# Patient Record
Sex: Female | Born: 1985 | Race: White | Hispanic: No | Marital: Married | Smoking: Current every day smoker
Health system: Southern US, Community
[De-identification: ages and names within clinical notes are randomized; demographics above are authoritative.]

## PROBLEM LIST (undated history)

## (undated) DIAGNOSIS — F419 Anxiety disorder, unspecified: Secondary | ICD-10-CM

## (undated) DIAGNOSIS — J45909 Unspecified asthma, uncomplicated: Secondary | ICD-10-CM

## (undated) DIAGNOSIS — F329 Major depressive disorder, single episode, unspecified: Secondary | ICD-10-CM

## (undated) DIAGNOSIS — F32A Depression, unspecified: Secondary | ICD-10-CM

## (undated) HISTORY — DX: Anxiety disorder, unspecified: F41.9

## (undated) HISTORY — DX: Depression, unspecified: F32.A

## (undated) HISTORY — PX: SHOULDER SURGERY: SHX246

---
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
## 2003-10-10 ENCOUNTER — Emergency Department (HOSPITAL_COMMUNITY): Admission: EM | Admit: 2003-10-10 | Discharge: 2003-10-10 | Payer: Self-pay | Admitting: Emergency Medicine

## 2004-03-23 ENCOUNTER — Emergency Department (HOSPITAL_COMMUNITY): Admission: EM | Admit: 2004-03-23 | Discharge: 2004-03-23 | Payer: Self-pay | Admitting: Emergency Medicine

## 2004-05-06 ENCOUNTER — Emergency Department (HOSPITAL_COMMUNITY): Admission: EM | Admit: 2004-05-06 | Discharge: 2004-05-06 | Payer: Self-pay | Admitting: Emergency Medicine

## 2004-06-13 ENCOUNTER — Emergency Department (HOSPITAL_COMMUNITY): Admission: EM | Admit: 2004-06-13 | Discharge: 2004-06-13 | Payer: Self-pay | Admitting: Emergency Medicine

## 2004-07-19 ENCOUNTER — Emergency Department (HOSPITAL_COMMUNITY): Admission: EM | Admit: 2004-07-19 | Discharge: 2004-07-19 | Payer: Self-pay | Admitting: Emergency Medicine

## 2004-12-30 ENCOUNTER — Emergency Department (HOSPITAL_COMMUNITY): Admission: EM | Admit: 2004-12-30 | Discharge: 2004-12-30 | Payer: Self-pay | Admitting: Emergency Medicine

## 2005-05-19 ENCOUNTER — Emergency Department (HOSPITAL_COMMUNITY): Admission: EM | Admit: 2005-05-19 | Discharge: 2005-05-19 | Payer: Self-pay | Admitting: Emergency Medicine

## 2007-04-12 ENCOUNTER — Emergency Department (HOSPITAL_COMMUNITY): Admission: EM | Admit: 2007-04-12 | Discharge: 2007-04-12 | Payer: Self-pay | Admitting: Emergency Medicine

## 2007-08-10 ENCOUNTER — Emergency Department (HOSPITAL_COMMUNITY): Admission: EM | Admit: 2007-08-10 | Discharge: 2007-08-10 | Payer: Self-pay | Admitting: Emergency Medicine

## 2008-12-08 ENCOUNTER — Emergency Department (HOSPITAL_COMMUNITY): Admission: EM | Admit: 2008-12-08 | Discharge: 2008-12-08 | Payer: Self-pay | Admitting: Emergency Medicine

## 2009-01-30 ENCOUNTER — Emergency Department (HOSPITAL_COMMUNITY): Admission: EM | Admit: 2009-01-30 | Discharge: 2009-01-30 | Payer: Self-pay | Admitting: Emergency Medicine

## 2010-07-14 LAB — BASIC METABOLIC PANEL
CO2: 30 mEq/L (ref 19–32)
GFR calc Af Amer: >60 mL/min (ref >60)
GFR calc non Af Amer: >60 mL/min (ref >60)
Glucose, Bld: 103 mg/dL — ABNORMAL HIGH (ref 70–99)
Potassium: 3.5 mEq/L (ref 3.5–5.1)
Sodium: 139 mEq/L (ref 135–145)

## 2010-07-14 LAB — DIFFERENTIAL
Basophils Absolute: 0 10*3/uL (ref 0.0–0.1)
Eosinophils Relative: 0 % (ref 0–5)
Lymphocytes Relative: 20 % (ref 12–46)
Monocytes Absolute: 0.5 10*3/uL (ref 0.1–1.0)
Neutro Abs: 6.6 10*3/uL (ref 1.7–7.7)
Neutrophils Relative %: 73 % (ref 43–77)

## 2010-07-14 LAB — URINE MICROSCOPIC-ADD ON

## 2010-07-14 LAB — RAPID URINE DRUG SCREEN, HOSP PERFORMED
Barbiturates: NOT DETECTED
Opiates: NOT DETECTED
Tetrahydrocannabinol: NOT DETECTED

## 2010-07-14 LAB — URINALYSIS, ROUTINE W REFLEX MICROSCOPIC
Ketones, ur: NEGATIVE mg/dL
Protein, ur: NEGATIVE mg/dL
Specific Gravity, Urine: 1.015 (ref 1.005–1.030)
Urobilinogen, UA: 0.2 mg/dL (ref 0.0–1.0)
pH: 6 (ref 5.0–8.0)

## 2010-07-14 LAB — CBC: HCT: 43.6 % (ref 36.0–46.0)

## 2010-07-14 LAB — PREGNANCY, URINE: Preg Test, Ur: NEGATIVE

## 2010-08-07 ENCOUNTER — Emergency Department (HOSPITAL_COMMUNITY): Payer: Self-pay

## 2010-08-07 ENCOUNTER — Emergency Department (HOSPITAL_COMMUNITY)
Admission: EM | Admit: 2010-08-07 | Discharge: 2010-08-07 | Disposition: A | Discharge status: Home / Self Care | Payer: Self-pay | Attending: Emergency Medicine | Admitting: Emergency Medicine

## 2010-08-07 DIAGNOSIS — R509 Fever, unspecified: Secondary | ICD-10-CM | POA: Insufficient documentation

## 2010-08-07 DIAGNOSIS — R059 Cough, unspecified: Secondary | ICD-10-CM | POA: Insufficient documentation

## 2010-08-07 DIAGNOSIS — J189 Pneumonia, unspecified organism: Secondary | ICD-10-CM | POA: Insufficient documentation

## 2010-08-07 DIAGNOSIS — R05 Cough: Secondary | ICD-10-CM | POA: Insufficient documentation

## 2010-08-15 ENCOUNTER — Emergency Department (HOSPITAL_COMMUNITY)
Admission: EM | Admit: 2010-08-15 | Discharge: 2010-08-16 | Disposition: A | Discharge status: Home / Self Care | Payer: Self-pay | Attending: Emergency Medicine | Admitting: Emergency Medicine

## 2010-08-15 DIAGNOSIS — Y998 Other external cause status: Secondary | ICD-10-CM | POA: Insufficient documentation

## 2010-08-15 DIAGNOSIS — R1032 Left lower quadrant pain: Secondary | ICD-10-CM | POA: Insufficient documentation

## 2010-08-15 DIAGNOSIS — R071 Chest pain on breathing: Secondary | ICD-10-CM | POA: Insufficient documentation

## 2010-08-15 DIAGNOSIS — J45909 Unspecified asthma, uncomplicated: Secondary | ICD-10-CM | POA: Insufficient documentation

## 2010-08-15 DIAGNOSIS — Y92410 Unspecified street and highway as the place of occurrence of the external cause: Secondary | ICD-10-CM | POA: Insufficient documentation

## 2010-08-16 ENCOUNTER — Emergency Department (HOSPITAL_COMMUNITY): Payer: Self-pay

## 2010-08-16 LAB — POCT PREGNANCY, URINE: Preg Test, Ur: NEGATIVE

## 2010-08-16 MED ORDER — IOHEXOL 300 MG/ML  SOLN
100.0000 mL | Freq: Once | INTRAMUSCULAR | Status: AC | PRN
  Administered 2010-08-16: 100 mL via INTRAVENOUS

## 2013-02-26 ENCOUNTER — Emergency Department (HOSPITAL_COMMUNITY)
Admission: EM | Admit: 2013-02-26 | Discharge: 2013-02-26 | Disposition: A | Discharge status: Home / Self Care | Payer: Self-pay | Attending: Emergency Medicine | Admitting: Emergency Medicine

## 2013-02-26 ENCOUNTER — Encounter (HOSPITAL_COMMUNITY): Payer: Self-pay | Admitting: Emergency Medicine

## 2013-02-26 DIAGNOSIS — R21 Rash and other nonspecific skin eruption: Secondary | ICD-10-CM | POA: Insufficient documentation

## 2013-02-26 DIAGNOSIS — F17200 Nicotine dependence, unspecified, uncomplicated: Secondary | ICD-10-CM | POA: Insufficient documentation

## 2013-02-26 DIAGNOSIS — J45909 Unspecified asthma, uncomplicated: Secondary | ICD-10-CM | POA: Insufficient documentation

## 2013-02-26 DIAGNOSIS — B002 Herpesviral gingivostomatitis and pharyngotonsillitis: Secondary | ICD-10-CM | POA: Insufficient documentation

## 2013-02-26 HISTORY — DX: Unspecified asthma, uncomplicated: J45.909

## 2013-02-26 MED ORDER — HYDROCODONE-ACETAMINOPHEN 5-325 MG PO TABS: 1.0000 | ORAL_TABLET | ORAL | Status: DC | PRN

## 2013-02-26 MED ORDER — ACYCLOVIR 400 MG PO TABS: 400.0000 mg | ORAL_TABLET | Freq: Three times a day (TID) | ORAL | Status: DC

## 2013-02-26 NOTE — ED Notes (Signed)
Pt alert & oriented x4, stable gait. Patient given discharge instructions, paperwork & prescription(s). Patient  instructed to stop at the registration desk to finish any additional paperwork. Patient verbalized understanding. Pt left department w/ no further questions. 

## 2013-02-26 NOTE — ED Provider Notes (Signed)
CSN: [id number]     Arrival date & time 02/26/13  4098 History   First MD Initiated Contact with Patient 02/26/13 0957     Chief Complaint  Patient presents with  . Mouth Lesions   (Consider location/radiation/quality/duration/timing/severity/associated sxs/prior Treatment) Patient is a 27 y.o. female presenting with mouth sores. The history is provided by the patient.  Mouth Lesions Location:  Upper lip Quality:  Painful, crusty and multiple Pain details:    Quality:  Hot, sore and tingling   Severity:  Moderate   Duration:  2 days   Timing:  Constant   Progression:  Worsening Onset quality:  Gradual Chronicity:  Recurrent Context: stress   Relieved by:  Nothing Worsened by:  Nothing tried Ineffective treatments:  None tried Associated symptoms: rash   Associated symptoms: no congestion, no fever and no sore throat    Stacy Rogers is a 27 y.o. female who presents to the ED with lesion to the upper lip that started 2 days ago. Stacy Rogers has been under a lot of stress and often gets the lesions when Stacy Rogers is. The areas burn and hurt.   Past Medical History  Diagnosis Date  . Asthma    History reviewed. No pertinent past surgical history. No family history on file. History  Substance Use Topics  . Smoking status: Current Every Day Smoker  . Smokeless tobacco: Not on file  . Alcohol Use: No   OB History   Grav Para Term Preterm Abortions TAB SAB Ect Mult Living                 Review of Systems  Constitutional: Negative for fever and chills.  HENT: Positive for mouth sores. Negative for congestion, sore throat, trouble swallowing and voice change.   Respiratory: Negative for cough and shortness of breath.   Gastrointestinal: Negative for nausea, vomiting and abdominal pain.  Genitourinary: Negative for dysuria, urgency and frequency.  Musculoskeletal: Negative for back pain.  Skin: Positive for rash.  Neurological: Negative for light-headedness and headaches.    Psychiatric/Behavioral: The patient is not nervous/anxious.     Allergies  Aspirin  Home Medications  No current outpatient prescriptions on file. BP 109/73  Pulse 95  Temp(Src) 98.8 F (37.1 C) (Oral)  Resp 18  Ht 5\' 4"  (1.626 m)  Wt 115 lb (52.164 kg)  BMI 19.73 kg/m2  SpO2 99%  LMP 02/25/2013 Physical Exam  Nursing note and vitals reviewed. Constitutional: Stacy Rogers is oriented to person, place, and time. Stacy Rogers appears well-developed and well-nourished. No distress.  HENT:  Head: Normocephalic.  Right Ear: Tympanic membrane normal.  Left Ear: Tympanic membrane normal.  Mouth/Throat: Uvula is midline and oropharynx is clear and moist.    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   Eyes: EOM are normal.  Neck: Neck supple.  Pulmonary/Chest: Effort normal.  Abdominal: Soft. There is no tenderness.  Musculoskeletal: Normal range of motion.  Lymphadenopathy:    Stacy Rogers has no cervical adenopathy.  Neurological: Stacy Rogers is alert and oriented to person, place, and time. No cranial nerve deficit.  Skin: Skin is warm and dry.  Psychiatric: Stacy Rogers has a normal mood and affect. Her behavior is normal.    ED Course  Procedures  EKG Interpretation   None       MDM  27 y.o. female with recurrent herpetic lesions to the upper lip. Will treat with antiviral medications. Stacy Rogers will follow up if symptoms worsen.  Discussed with the patient and all questioned fully  answered.    Medication List    TAKE these medications       acyclovir 400 MG tablet  Commonly known as:  ZOVIRAX  Take 1 tablet (400 mg total) by mouth 3 (three) times daily.     HYDROcodone-acetaminophen 5-325 MG per tablet  Commonly known as:  NORCO/VICODIN  Take 1 tablet by mouth every 4 (four) hours as needed.      ASK your doctor about these medications       acetaminophen 325 MG tablet  Commonly known as:  TYLENOL  Take 650 mg by mouth every 6 (six) hours as needed (cramps).     albuterol 108 (90 BASE)  MCG/ACT inhaler  Commonly known as:  PROVENTIL HFA;VENTOLIN HFA  Inhale 2 puffs into the lungs 2 (two) times daily as needed for wheezing or shortness of breath.             Janne Napoleon, [place] 02/26/13 [phone]

## 2013-02-26 NOTE — ED Notes (Signed)
Pt reports woke up Monday morning with lip swollen then started breaking out in sores.  Pt says has history of cold sores but never multiple sores at one time.

## 2013-02-27 NOTE — ED Provider Notes (Signed)
Medical screening examination/treatment/procedure(s) were performed by non-physician practitioner and as supervising physician I was immediately available for consultation/collaboration.  EKG Interpretation   None        Illana Nolting, MD 02/27/13 1326 

## 2016-10-27 ENCOUNTER — Encounter: Payer: Self-pay | Admitting: Family

## 2016-10-27 ENCOUNTER — Ambulatory Visit (INDEPENDENT_AMBULATORY_CARE_PROVIDER_SITE_OTHER): Payer: BLUE CROSS/BLUE SHIELD | Admitting: Family

## 2016-10-27 VITALS — BP 93/69 | HR 64 | Temp 98.4°F | Ht 64.0 in | Wt 94.0 lb

## 2016-10-27 DIAGNOSIS — R636 Underweight: Secondary | ICD-10-CM | POA: Diagnosis not present

## 2016-10-27 DIAGNOSIS — F17200 Nicotine dependence, unspecified, uncomplicated: Secondary | ICD-10-CM | POA: Diagnosis not present

## 2016-10-27 DIAGNOSIS — F419 Anxiety disorder, unspecified: Secondary | ICD-10-CM

## 2016-10-27 DIAGNOSIS — B002 Herpesviral gingivostomatitis and pharyngotonsillitis: Secondary | ICD-10-CM | POA: Diagnosis not present

## 2016-10-27 DIAGNOSIS — F32A Depression, unspecified: Secondary | ICD-10-CM

## 2016-10-27 DIAGNOSIS — J4540 Moderate persistent asthma, uncomplicated: Secondary | ICD-10-CM

## 2016-10-27 DIAGNOSIS — F329 Major depressive disorder, single episode, unspecified: Secondary | ICD-10-CM | POA: Diagnosis not present

## 2016-10-27 DIAGNOSIS — J45909 Unspecified asthma, uncomplicated: Secondary | ICD-10-CM | POA: Insufficient documentation

## 2016-10-27 DIAGNOSIS — Z720 Tobacco use: Secondary | ICD-10-CM | POA: Insufficient documentation

## 2016-10-27 DIAGNOSIS — R634 Abnormal weight loss: Secondary | ICD-10-CM

## 2016-10-27 DIAGNOSIS — F411 Generalized anxiety disorder: Secondary | ICD-10-CM | POA: Insufficient documentation

## 2016-10-27 MED ORDER — MONTELUKAST SODIUM 10 MG PO TABS: 10.0000 mg | ORAL_TABLET | Freq: Every day | ORAL | 1 refills | Status: AC

## 2016-10-27 MED ORDER — ALBUTEROL SULFATE HFA 108 (90 BASE) MCG/ACT IN AERS: 2.0000 | INHALATION_SPRAY | Freq: Two times a day (BID) | RESPIRATORY_TRACT | 2 refills | Status: AC | PRN

## 2016-10-27 MED ORDER — PREDNISONE 10 MG (21) PO TBPK
ORAL_TABLET | ORAL | 0 refills | Status: DC
Start: 2016-10-27 — End: 2016-12-28

## 2016-10-27 MED ORDER — ESCITALOPRAM OXALATE 10 MG PO TABS: 10.0000 mg | ORAL_TABLET | Freq: Every day | ORAL | 3 refills | Status: DC

## 2016-10-27 MED ORDER — ACYCLOVIR 400 MG PO TABS: 400.0000 mg | ORAL_TABLET | Freq: Three times a day (TID) | ORAL | 0 refills | Status: DC | PRN

## 2016-10-27 NOTE — Patient Instructions (Signed)

## 2016-10-27 NOTE — Progress Notes (Signed)
Subjective:    Patient ID: Stacy Rogers, female    DOB: 11-Aug-1985, 31 y.o.   MRN: [id number]  Pt presents to the office today to establish care. PT states she just got insurance and was seen by the [facility].  PT states she has lost 17 lb over the 3-4 months. Pt states she started a new job in March and has more anxiety and is very physically demanding.  Asthma  She complains of cough, sputum production and wheezing. This is a chronic problem. The current episode started more than 1 year ago. The problem occurs intermittently. The problem has been waxing and waning. The cough is productive. Her symptoms are aggravated by pollen, strenuous activity, emotional stress and exercise. Her symptoms are alleviated by beta-agonist. She reports minimal improvement on treatment. Her past medical history is significant for asthma.  Anxiety  Presents for initial visit. Onset was 1 to 5 years ago. The problem has been unchanged. Symptoms include depressed mood, excessive worry, insomnia, irritability, nervous/anxious behavior, palpitations, panic and restlessness. Symptoms occur constantly. The severity of symptoms is moderate.   Her past medical history is significant for asthma.  Oral Herpes PT takes acyclovir as needed. States this works well.     Review of Systems  Constitutional: Positive for irritability.  Respiratory: Positive for cough, sputum production and wheezing.   Cardiovascular: Positive for palpitations.  Psychiatric/Behavioral: The patient is nervous/anxious and has insomnia.   All other systems reviewed and are negative.  Family History  Problem Relation Age of Onset  . COPD Mother   . Alcohol abuse Father    Social History   Social History  . Marital status: Single    Spouse name: N/A  . Number of children: N/A  . Years of education: N/A   Social History Main Topics  . Smoking status: Current Every Day Smoker  . Smokeless tobacco: Never Used  . Alcohol use  No  . Drug use: No  . Sexual activity: Yes   Other Topics Concern  . None   Social History Narrative  . None        Objective:   Physical Exam  Constitutional: She is oriented to person, place, and time. She appears well-developed and well-nourished. No distress.  HENT:  Head: Normocephalic and atraumatic.  Right Ear: External ear normal.  Left Ear: External ear normal.  Nose: Nose normal.  Mouth/Throat: Oropharynx is clear and moist.  Eyes: Pupils are equal, round, and reactive to light.  Neck: Normal range of motion. Neck supple. No thyromegaly present.  Cardiovascular: Normal rate, regular rhythm, normal heart sounds and intact distal pulses.   No murmur heard. Pulmonary/Chest: Effort normal. No respiratory distress. She has wheezes.  Abdominal: Soft. Bowel sounds are normal. She exhibits no distension. There is no tenderness.  Musculoskeletal: Normal range of motion. She exhibits no edema or tenderness.  Neurological: She is alert and oriented to person, place, and time.  Skin: Skin is warm and dry.  Psychiatric: Her behavior is normal. Judgment and thought content normal. Her mood appears anxious.  Vitals reviewed.    BP 93/69   Pulse 64   Temp 98.4 F (36.9 C) (Oral)   Ht _0  (1.626 m)   Wt 94 lb (42.6 kg)   BMI 16.14 kg/m      Assessment & Plan:  1. Moderate persistent asthma without complication Smoking cessation discussed Will start singulair today - CMP14+EGFR - montelukast (SINGULAIR) 10 MG tablet; Take 1 tablet (10  mg total) by mouth at bedtime.  Dispense: 90 tablet; Refill: 1 - albuterol (PROVENTIL HFA;VENTOLIN HFA) 108 (90 Base) MCG/ACT inhaler; Inhale 2 puffs into the lungs 2 (two) times daily as needed for wheezing or shortness of breath.  Dispense: 8 g; Refill: 2 - predniSONE (STERAPRED UNI-PAK 21 TAB) 10 MG (21) TBPK tablet; Use as directed  Dispense: 21 tablet; Refill: 0  2. Current smoker - CMP14+EGFR  3. Oral herpes - CMP14+EGFR -  acyclovir (ZOVIRAX) 400 MG tablet; Take 1 tablet (400 mg total) by mouth 3 (three) times daily as needed.  Dispense: 50 tablet; Refill: 0  4. Anxiety and depression Start lexapro 10 mg  Stress management discussed RTO in 6 weeks - CMP14+EGFR - escitalopram (LEXAPRO) 10 MG tablet; Take 1 tablet (10 mg total) by mouth daily.  Dispense: 90 tablet; Refill: 3  5. Weight loss, unintentional - CMP14+EGFR - Thyroid Panel With TSH  6. Underweight Start protein shake with meals TID   Continue all meds Labs pending Health Maintenance reviewed Diet and exercise encouraged RTO 6 weeks to recheck GAD and Depression  Evelina Dun, FNP

## 2016-10-27 NOTE — Addendum Note (Signed)
Addended by: Jannifer RodneyHAWKS, CHRISTY A on: 10/27/2016 09:55 AM   Modules accepted: Orders

## 2016-10-28 LAB — CMP14+EGFR
A/G RATIO: 2 (ref 1.2–2.2)
ALBUMIN: 4.7 g/dL (ref 3.5–5.5)
ALK PHOS: 37 IU/L — AB (ref 39–117)
ALT: 12 IU/L (ref 0–32)
AST: 17 IU/L (ref 0–40)
BILIRUBIN TOTAL: 0.4 mg/dL (ref 0.0–1.2)
BUN / CREAT RATIO: 11 (ref 9–23)
BUN: 7 mg/dL (ref 6–20)
CHLORIDE: 102 mmol/L (ref 96–106)
CO2: 22 mmol/L (ref 20–29)
Calcium: 9 mg/dL (ref 8.7–10.2)
Creatinine, Ser: 0.63 mg/dL (ref 0.57–1.00)
GFR calc non Af Amer: 118 mL/min/{1.73_m2} (ref >59)
GFR, EST AFRICAN AMERICAN: 136 mL/min/{1.73_m2} (ref >59)
Globulin, Total: 2.4 g/dL (ref 1.5–4.5)
Glucose: 72 mg/dL (ref 65–99)
POTASSIUM: 4.1 mmol/L (ref 3.5–5.2)
Sodium: 141 mmol/L (ref 134–144)
TOTAL PROTEIN: 7.1 g/dL (ref 6.0–8.5)

## 2016-10-28 LAB — THYROID PANEL WITH TSH
Free Thyroxine Index: 2.3 (ref 1.2–4.9)
T3 Uptake Ratio: 30 % (ref 24–39)
T4, Total: 7.7 ug/dL (ref 4.5–12.0)
TSH: 0.911 u[IU]/mL (ref 0.450–4.500)

## 2016-10-30 ENCOUNTER — Encounter: Payer: Self-pay | Admitting: Family

## 2016-12-02 ENCOUNTER — Encounter: Payer: Self-pay | Admitting: Family

## 2016-12-05 ENCOUNTER — Other Ambulatory Visit: Payer: Self-pay | Admitting: Family

## 2016-12-05 MED ORDER — ESCITALOPRAM OXALATE 20 MG PO TABS: 20.0000 mg | ORAL_TABLET | Freq: Every day | ORAL | 5 refills | Status: DC

## 2016-12-12 ENCOUNTER — Ambulatory Visit: Payer: BLUE CROSS/BLUE SHIELD | Admitting: Family

## 2016-12-13 ENCOUNTER — Encounter: Payer: Self-pay | Admitting: Family

## 2016-12-13 ENCOUNTER — Telehealth: Payer: Self-pay | Admitting: Family

## 2016-12-13 NOTE — Telephone Encounter (Signed)
Patient phone numbers are not working therefore was unable to leave a voicemail or speak to them about [facility].

## 2016-12-19 ENCOUNTER — Ambulatory Visit: Payer: BLUE CROSS/BLUE SHIELD | Admitting: Family

## 2016-12-19 ENCOUNTER — Encounter: Payer: Self-pay | Admitting: Family

## 2016-12-28 ENCOUNTER — Encounter: Payer: Self-pay | Admitting: Family

## 2016-12-28 ENCOUNTER — Ambulatory Visit (INDEPENDENT_AMBULATORY_CARE_PROVIDER_SITE_OTHER): Payer: BLUE CROSS/BLUE SHIELD | Admitting: Family

## 2016-12-28 ENCOUNTER — Telehealth: Payer: Self-pay | Admitting: Family

## 2016-12-28 VITALS — BP 93/62 | HR 65 | Temp 97.6°F | Ht 64.0 in | Wt 97.8 lb

## 2016-12-28 DIAGNOSIS — J4540 Moderate persistent asthma, uncomplicated: Secondary | ICD-10-CM | POA: Diagnosis not present

## 2016-12-28 DIAGNOSIS — F331 Major depressive disorder, recurrent, moderate: Secondary | ICD-10-CM | POA: Diagnosis not present

## 2016-12-28 DIAGNOSIS — F17200 Nicotine dependence, unspecified, uncomplicated: Secondary | ICD-10-CM | POA: Diagnosis not present

## 2016-12-28 DIAGNOSIS — R636 Underweight: Secondary | ICD-10-CM | POA: Diagnosis not present

## 2016-12-28 DIAGNOSIS — Z716 Tobacco abuse counseling: Secondary | ICD-10-CM | POA: Diagnosis not present

## 2016-12-28 DIAGNOSIS — F411 Generalized anxiety disorder: Secondary | ICD-10-CM

## 2016-12-28 DIAGNOSIS — F32A Depression, unspecified: Secondary | ICD-10-CM | POA: Insufficient documentation

## 2016-12-28 DIAGNOSIS — Z720 Tobacco use: Secondary | ICD-10-CM

## 2016-12-28 DIAGNOSIS — J0100 Acute maxillary sinusitis, unspecified: Secondary | ICD-10-CM | POA: Diagnosis not present

## 2016-12-28 DIAGNOSIS — F329 Major depressive disorder, single episode, unspecified: Secondary | ICD-10-CM | POA: Insufficient documentation

## 2016-12-28 MED ORDER — AMOXICILLIN-POT CLAVULANATE 875-125 MG PO TABS: 1.0000 | ORAL_TABLET | Freq: Two times a day (BID) | ORAL | 0 refills | Status: DC

## 2016-12-28 MED ORDER — BUPROPION HCL ER (SR) 150 MG PO TB12: 150.0000 mg | ORAL_TABLET | Freq: Two times a day (BID) | ORAL | 3 refills | Status: DC

## 2016-12-28 MED ORDER — PREDNISONE 10 MG (21) PO TBPK: ORAL_TABLET | ORAL | 0 refills | Status: DC

## 2016-12-28 MED ORDER — FLUTICASONE PROPIONATE 50 MCG/ACT NA SUSP: 2.0000 | Freq: Every day | NASAL | 6 refills | Status: DC

## 2016-12-28 NOTE — Telephone Encounter (Signed)
All new scripts from today were called to [place] [facility].  Left orders on [facility] voice mail to cancel those same scripts.  Unable to contact patient due to phone not in working order.

## 2016-12-28 NOTE — Patient Instructions (Signed)
Steps to Quit Smoking Smoking tobacco can be harmful to your health and can affect almost every organ in your body. Smoking puts you, and those around you, at risk for developing many serious chronic diseases. Quitting smoking is difficult, but it is one of the best things that you can do for your health. It is never too late to quit. What are the benefits of quitting smoking? When you quit smoking, you lower your risk of developing serious diseases and conditions, such as:  Lung cancer or lung disease, such as COPD.  Heart disease.  Stroke.  Heart attack.  Infertility.  Osteoporosis and bone fractures.  Additionally, symptoms such as coughing, wheezing, and shortness of breath may get better when you quit. You may also find that you get sick less often because your body is stronger at fighting off colds and infections. If you are pregnant, quitting smoking can help to reduce your chances of having a baby of low birth weight. How do I get ready to quit? When you decide to quit smoking, create a plan to make sure that you are successful. Before you quit:  Pick a date to quit. Set a date within the next two weeks to give you time to prepare.  Write down the reasons why you are quitting. Keep this list in places where you will see it often, such as on your bathroom mirror or in your car or wallet.  Identify the people, places, things, and activities that make you want to smoke (triggers) and avoid them. Make sure to take these actions: ? Throw away all cigarettes at home, at work, and in your car. ? Throw away smoking accessories, such as ashtrays and lighters. ? Clean your car and make sure to empty the ashtray. ? Clean your home, including curtains and carpets.  Tell your family, friends, and coworkers that you are quitting. Support from your loved ones can make quitting easier.  Talk with your health care provider about your options for quitting smoking.  Find out what treatment  options are covered by your health insurance.  What strategies can I use to quit smoking? Talk with your healthcare provider about different strategies to quit smoking. Some strategies include:  Quitting smoking altogether instead of gradually lessening how much you smoke over a period of time. Research shows that quitting "cold turkey" is more successful than gradually quitting.  Attending in-person counseling to help you build problem-solving skills. You are more likely to have success in quitting if you attend several counseling sessions. Even short sessions of 10 minutes can be effective.  Finding resources and support systems that can help you to quit smoking and remain smoke-free after you quit. These resources are most helpful when you use them often. They can include: ? Online chats with a counselor. ? Telephone quitlines. ? Printed self-help materials. ? Support groups or group counseling. ? Text messaging programs. ? Mobile phone applications.  Taking medicines to help you quit smoking. (If you are pregnant or breastfeeding, talk with your health care provider first.) Some medicines contain nicotine and some do not. Both types of medicines help with cravings, but the medicines that include nicotine help to relieve withdrawal symptoms. Your health care provider may recommend: ? Nicotine patches, gum, or lozenges. ? Nicotine inhalers or sprays. ? Non-nicotine medicine that is taken by mouth.  Talk with your health care provider about combining strategies, such as taking medicines while you are also receiving in-person counseling. Using these two strategies together   makes you more likely to succeed in quitting than if you used either strategy on its own. If you are pregnant or breastfeeding, talk with your health care provider about finding counseling or other support strategies to quit smoking. Do not take medicine to help you quit smoking unless told to do so by your health care  provider. What things can I do to make it easier to quit? Quitting smoking might feel overwhelming at first, but there is a lot that you can do to make it easier. Take these important actions:  Reach out to your family and friends and ask that they support and encourage you during this time. Call telephone quitlines, reach out to support groups, or work with a counselor for support.  Ask people who smoke to avoid smoking around you.  Avoid places that trigger you to smoke, such as bars, parties, or smoke-break areas at work.  Spend time around people who do not smoke.  Lessen stress in your life, because stress can be a smoking trigger for some people. To lessen stress, try: ? Exercising regularly. ? Deep-breathing exercises. ? Yoga. ? Meditating. ? Performing a body scan. This involves closing your eyes, scanning your body from head to toe, and noticing which parts of your body are particularly tense. Purposefully relax the muscles in those areas.  Download or purchase mobile phone or tablet apps (applications) that can help you stick to your quit plan by providing reminders, tips, and encouragement. There are many free apps, such as QuitGuide from the CDC (Centers for Disease Control and Prevention). You can find other support for quitting smoking (smoking cessation) through smokefree.gov and other websites.  How will I feel when I quit smoking? Within the first 24 hours of quitting smoking, you may start to feel some withdrawal symptoms. These symptoms are usually most noticeable 2-3 days after quitting, but they usually do not last beyond 2-3 weeks. Changes or symptoms that you might experience include:  Mood swings.  Restlessness, anxiety, or irritation.  Difficulty concentrating.  Dizziness.  Strong cravings for sugary foods in addition to nicotine.  Mild weight gain.  Constipation.  Nausea.  Coughing or a sore throat.  Changes in how your medicines work in your  body.  A depressed mood.  Difficulty sleeping (insomnia).  After the first 2-3 weeks of quitting, you may start to notice more positive results, such as:  Improved sense of smell and taste.  Decreased coughing and sore throat.  Slower heart rate.  Lower blood pressure.  Clearer skin.  The ability to breathe more easily.  Fewer sick days.  Quitting smoking is very challenging for most people. Do not get discouraged if you are not successful the first time. Some people need to make many attempts to quit before they achieve long-term success. Do your best to stick to your quit plan, and talk with your health care provider if you have any questions or concerns. This information is not intended to replace advice given to you by your health care provider. Make sure you discuss any questions you have with your health care provider. Document Released: 03/21/2001 Document Revised: 11/23/2015 Document Reviewed: 08/11/2014 Elsevier Interactive Patient Education  2017 Elsevier Inc.  

## 2016-12-28 NOTE — Progress Notes (Signed)
Subjective:    Patient ID: Stacy Rogers, female    DOB: [birth year], 31 y.o.   MRN: [id number]  PT presents to the office today recheck GAD and depression. PT states she has gained 3 lbs since our last visit and is still trying to gain weight. Pt also requesting smoking cessation. Pt states her breathing is worse and she feels this is related to her smoking. Pt states she has tried quitting several times with no success.  Anxiety  Presents for follow-up visit. Symptoms include excessive worry, irritability, nervous/anxious behavior and restlessness. Patient reports no depressed mood or panic. Symptoms occur most days. The severity of symptoms is moderate. The quality of sleep is good.   Her past medical history is significant for asthma.  Depression         This is a chronic problem.  The current episode started more than 1 year ago.   The onset quality is gradual.   The problem occurs intermittently.  The problem has been waxing and waning since onset.  Associated symptoms include irritable and restlessness.  Associated symptoms include no helplessness, no hopelessness and not sad.  Past treatments include SSRIs - Selective serotonin reuptake inhibitors.  Past medical history includes anxiety.   Asthma  She complains of cough, frequent throat clearing, sputum production and wheezing. This is a chronic problem. The current episode started 1 to 4 weeks ago. Associated symptoms include nasal congestion and rhinorrhea. Pertinent negatives include no ear congestion, ear pain or fever. Her symptoms are alleviated by rest. Her past medical history is significant for asthma.      Review of Systems  Constitutional: Positive for irritability. Negative for fever.  HENT: Positive for rhinorrhea. Negative for ear pain.   Respiratory: Positive for cough, sputum production and wheezing.   Psychiatric/Behavioral: Positive for depression. The patient is nervous/anxious.   All other systems reviewed and  are negative.      Objective:   Physical Exam  Constitutional: She is oriented to person, place, and time. She appears well-developed and well-nourished. She is irritable. No distress.  HENT:  Head: Normocephalic and atraumatic.  Right Ear: External ear normal.  Left Ear: External ear normal.  Nose: Mucosal edema and rhinorrhea present. Right sinus exhibits maxillary sinus tenderness. Left sinus exhibits maxillary sinus tenderness.  Mouth/Throat: Posterior oropharyngeal erythema present.  Eyes: Pupils are equal, round, and reactive to light.  Neck: Normal range of motion. Neck supple. No thyromegaly present.  Cardiovascular: Normal rate, regular rhythm, normal heart sounds and intact distal pulses.   No murmur heard. Pulmonary/Chest: Effort normal. No respiratory distress. She has wheezes.  Abdominal: Soft. Bowel sounds are normal. She exhibits no distension. There is no tenderness.  Musculoskeletal: Normal range of motion. She exhibits no edema or tenderness.  Neurological: She is alert and oriented to person, place, and time.  Skin: Skin is warm and dry.  Psychiatric: She has a normal mood and affect. Her behavior is normal. Judgment and thought content normal.  Vitals reviewed.     BP 93/62   Pulse 65   Temp 97.6 F (36.4 C) (Oral)   Ht  (1.626 m)   Wt 97 lb 12.8 oz (44.4 kg)   BMI 16.79 kg/m      Assessment & Plan:  1. GAD (generalized anxiety disorder)  2. Current smoker Smoking cessation discussed  3. Underweight Continue Boost shakes  4. Moderate episode of recurrent major depressive disorder (HCC)  5. Moderate persistent asthma without complication -  predniSONE (STERAPRED UNI-PAK 21 TAB) 10 MG (21) TBPK tablet; Use as directed  Dispense: 21 tablet; Refill: 0 - fluticasone (FLONASE) 50 MCG/ACT nasal spray; Place 2 sprays into both nostrils daily.  Dispense: 16 g; Refill: 6  6. Acute maxillary sinusitis, recurrence not specified - Take meds as  prescribed - Use a cool mist humidifier  -Force fluids -For any cough or congestion  Use plain Mucinex- regular strength or max strength is fine -Throat lozenges if help - predniSONE (STERAPRED UNI-PAK 21 TAB) 10 MG (21) TBPK tablet; Use as directed  Dispense: 21 tablet; Refill: 0 - fluticasone (FLONASE) 50 MCG/ACT nasal spray; Place 2 sprays into both nostrils daily.  Dispense: 16 g; Refill: 6 - amoxicillin-clavulanate (AUGMENTIN) 875-125 MG tablet; Take 1 tablet by mouth 2 (two) times daily.  Dispense: 14 tablet; Refill: 0  7. Encounter for smoking cessation counseling Will start on Wellbutrin today, pt to take take Wellbutrin daily for 3 days then BID - buPROPion (WELLBUTRIN SR) 150 MG 12 hr tablet; Take 1 tablet (150 mg total) by mouth 2 (two) times daily.  Dispense: 60 tablet; Refill: 3   Continue all meds Labs pending Health Maintenance reviewed Diet and exercise encouraged RTO 3 months for CPE  Jannifer Rodney, FNP

## 2017-01-22 ENCOUNTER — Encounter: Payer: BLUE CROSS/BLUE SHIELD | Admitting: Family

## 2017-01-25 ENCOUNTER — Encounter: Payer: Self-pay | Admitting: Family

## 2017-03-29 ENCOUNTER — Ambulatory Visit: Payer: BLUE CROSS/BLUE SHIELD | Admitting: Family

## 2017-04-05 ENCOUNTER — Encounter: Payer: Self-pay | Admitting: Family

## 2017-06-19 ENCOUNTER — Encounter (INDEPENDENT_AMBULATORY_CARE_PROVIDER_SITE_OTHER): Payer: Self-pay | Admitting: Orthopaedic Surgery

## 2017-06-19 ENCOUNTER — Ambulatory Visit (INDEPENDENT_AMBULATORY_CARE_PROVIDER_SITE_OTHER): Payer: BLUE CROSS/BLUE SHIELD

## 2017-06-19 ENCOUNTER — Ambulatory Visit (INDEPENDENT_AMBULATORY_CARE_PROVIDER_SITE_OTHER): Payer: BLUE CROSS/BLUE SHIELD | Admitting: Orthopaedic Surgery

## 2017-06-19 DIAGNOSIS — M542 Cervicalgia: Secondary | ICD-10-CM | POA: Diagnosis not present

## 2017-06-19 MED ORDER — PREDNISONE 10 MG (21) PO TBPK: ORAL_TABLET | ORAL | 0 refills | Status: DC
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
## 2017-06-20 ENCOUNTER — Telehealth (INDEPENDENT_AMBULATORY_CARE_PROVIDER_SITE_OTHER): Payer: Self-pay | Admitting: Orthopaedic Surgery

## 2017-06-20 ENCOUNTER — Other Ambulatory Visit: Payer: Self-pay | Admitting: Family

## 2017-06-20 ENCOUNTER — Other Ambulatory Visit (INDEPENDENT_AMBULATORY_CARE_PROVIDER_SITE_OTHER): Payer: Self-pay

## 2017-06-20 DIAGNOSIS — B002 Herpesviral gingivostomatitis and pharyngotonsillitis: Secondary | ICD-10-CM

## 2017-06-20 MED ORDER — ACYCLOVIR 400 MG PO TABS: 400.0000 mg | ORAL_TABLET | Freq: Three times a day (TID) | ORAL | 0 refills | Status: DC | PRN

## 2017-06-20 MED ORDER — PREDNISONE 10 MG (21) PO TBPK: ORAL_TABLET | ORAL | 0 refills | Status: DC

## 2017-06-20 NOTE — Telephone Encounter (Signed)
Rx refaxed into pharm, patient aware.

## 2017-06-20 NOTE — Telephone Encounter (Signed)
Patient called saying that the pharmacy never received the RX for the prednisone yesterday and she was wondering if it could be sent in again. Please give her a call whenever it's been sent in, thank you! CB # [phone]

## 2017-06-21 ENCOUNTER — Ambulatory Visit (INDEPENDENT_AMBULATORY_CARE_PROVIDER_SITE_OTHER): Payer: BLUE CROSS/BLUE SHIELD | Admitting: Surgery

## 2017-06-27 ENCOUNTER — Encounter: Payer: Self-pay | Admitting: Physical Therapy

## 2017-06-27 ENCOUNTER — Ambulatory Visit: Payer: BLUE CROSS/BLUE SHIELD | Attending: Orthopaedic Surgery | Admitting: Physical Therapy

## 2017-06-27 DIAGNOSIS — R293 Abnormal posture: Secondary | ICD-10-CM | POA: Diagnosis present

## 2017-06-27 DIAGNOSIS — M542 Cervicalgia: Secondary | ICD-10-CM | POA: Diagnosis present
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
## 2017-07-03 ENCOUNTER — Ambulatory Visit: Payer: BLUE CROSS/BLUE SHIELD | Admitting: Physical Therapy

## 2017-07-03 DIAGNOSIS — M542 Cervicalgia: Secondary | ICD-10-CM

## 2017-07-03 DIAGNOSIS — R293 Abnormal posture: Secondary | ICD-10-CM

## 2017-07-03 NOTE — Therapy (Signed)
[facility] [address] [place], [place], [address] Phone: [phone]   Fax:  [phone]  Physical Therapy Treatment  Patient Details  Name: Stacy Rogers MRN: [id number] Date of Birth: April 28, 1985 Referring Provider: Gershon Mussel MD.   Encounter Date: 07/03/2017  PT End of Session - 07/03/17 1400    Visit Number  2    Number of Visits  16    Date for PT Re-Evaluation  08/22/17    PT Start Time  1400    PT Stop Time  1440 2 units secondary to late arrival and limtation d/t pain    PT Time Calculation (min)  40 min    Activity Tolerance  Patient tolerated treatment well    Behavior During Therapy  [facility] for tasks assessed/performed       No past medical history on file.  No past surgical history on file.  There were no vitals filed for this visit.  Subjective Assessment - 07/03/17 1359    Subjective  To the point of crying today with pain.    Patient Stated Goals  Get back to normal so I can go back to work.    Currently in Pain?  Yes    Pain Score  10-Worst pain ever    Pain Location  Neck    Pain Orientation  Left    Pain Descriptors / Indicators  Aching;Sharp;Burning    Pain Type  Acute pain    Pain Onset  1 to 4 weeks ago    Pain Frequency  Constant         [facility] PT Assessment - 07/03/17 0001      Assessment   Medical Diagnosis  Cervical strain    Onset Date/Surgical Date  05/30/17      Precautions   Precautions  None      Restrictions   Weight Bearing Restrictions  No            No data recorded       [facility] Adult PT Treatment/Exercise - 07/03/17 0001      Modalities   Modalities  Electrical Stimulation;Moist Heat;Ultrasound      Moist Heat Therapy   Number Minutes Moist Heat  20 Minutes    Moist Heat Location  Cervical      Electrical Stimulation   Electrical Stimulation Location  Left UT    Electrical Stimulation Action  Pre-Mod    Electrical Stimulation Parameters  80-150 hz x20 min     Electrical Stimulation Goals  Pain;Tone      Ultrasound   Ultrasound Location  L UT    Ultrasound Parameters  Combo 1.5 w/cm2, 100%, 1 mhz x10 min    Ultrasound Goals  Pain                  PT Long Term Goals - 06/27/17 1138      PT LONG TERM GOAL #1   Title  Independent with a HEP.      PT LONG TERM GOAL #2   Title  Increase active cervical rotation to 80 degrees+ so patient can turn head more easily while driving.    Time  8    Period  Weeks    Status  New      PT LONG TERM GOAL #3   Title  Eliminate left UE pain and symptoms.    Time  8    Period  Weeks    Status  New  PT LONG TERM GOAL #4   Title  Perform ADL's with pain not > 3/10.    Time  8    Period  Weeks    Status  New            Plan - 07/03/17 1526    Clinical Impression Statement  Patient presented in clinic with increased pain and sensitivity to L UT and Levator Scapula region. Patient upon arrival to point of crying from cervical pain and observed in minimal tears during combo treatment. Patient very sensitive combo US treatment as well as light palpation of affected musculature. Electrical stimulation and moist heat completed for 20 minutes today in dark room for relaxation. Patient educated regarding potential for purchase of home TENS unit with good tolerance of today's treatment.    Rehab Potential  Good    PT Frequency  2x / week    PT Duration  8 weeks    PT Treatment/Interventions  ADLs/Self Care Home Management;Electrical Stimulation;Cryotherapy;Moist Heat;Ultrasound;Therapeutic exercise;Therapeutic activities;Patient/family education;Passive range of motion;Manual techniques;Dry needling;Vasopneumatic Device    PT Next Visit Plan  Attempt regular US next treatment if patient very sensitive to combo per approval from [place], MPT.    Consulted and Agree with Plan of Care  Patient       Patient will benefit from skilled therapeutic intervention in order to improve the  following deficits and impairments:  Decreased activity tolerance, Decreased range of motion, Decreased strength, Increased muscle spasms, Postural dysfunction, Pain  Visit Diagnosis: Cervicalgia  Abnormal posture     Problem List Patient Active Problem List   Diagnosis Date Noted  . Neck pain 06/19/2017    Marvell FullerKelsey P Kennon, PTA 07/03/2017, 3:36 PM  [facility] [address] [place], [place], [address] Phone: [phone]   Fax:  [phone]  Name: Stacy Rogers MRN: [id number] Date of Birth: 09/16/1985

## 2017-07-05 ENCOUNTER — Encounter: Payer: Self-pay | Admitting: Physical Therapy

## 2017-07-05 ENCOUNTER — Ambulatory Visit: Payer: BLUE CROSS/BLUE SHIELD | Admitting: Physical Therapy

## 2017-07-05 DIAGNOSIS — R293 Abnormal posture: Secondary | ICD-10-CM

## 2017-07-05 DIAGNOSIS — M542 Cervicalgia: Secondary | ICD-10-CM | POA: Diagnosis not present

## 2017-07-05 NOTE — Therapy (Signed)
[facility] [address] [place], [place], [address] Phone: [phone]   Fax:  [phone]  Physical Therapy Treatment  Patient Details  Name: Stacy Rogers MRN: [id number] Date of Birth: 12/24/1985 Referring Provider: Gershon MusselNaiping Xu MD.   Encounter Date: 07/05/2017  PT End of Session - 07/05/17 1344    Visit Number  3    Number of Visits  16    Date for PT Re-Evaluation  08/22/17    PT Start Time  1348    PT Stop Time  1431 2 units per no electrical stimulation    PT Time Calculation (min)  43 min    Activity Tolerance  Patient tolerated treatment well    Behavior During Therapy  [facility] for tasks assessed/performed       History reviewed. No pertinent past medical history.  History reviewed. No pertinent surgical history.  There were no vitals filed for this visit.  Subjective Assessment - 07/05/17 1344    Subjective  Reports that she had pain yesterday but pain better today.    Patient Stated Goals  Get back to normal so I can go back to work.    Currently in Pain?  Yes    Pain Score  7     Pain Location  Neck    Pain Orientation  Left    Pain Descriptors / Indicators  Aching;Burning    Pain Type  Acute pain    Pain Radiating Towards  to L hand    Pain Onset  1 to 4 weeks ago    Pain Frequency  Constant         [facility] PT Assessment - 07/05/17 0001      Assessment   Medical Diagnosis  Cervical strain    Onset Date/Surgical Date  05/30/17      Precautions   Precautions  None      Restrictions   Weight Bearing Restrictions  No            No data recorded       [facility] Adult PT Treatment/Exercise - 07/05/17 0001      Modalities   Modalities  Moist Heat;Ultrasound      Moist Heat Therapy   Number Minutes Moist Heat  15 Minutes    Moist Heat Location  Cervical      Ultrasound   Ultrasound Location  L UT    Ultrasound Parameters  1.5 w/cm2, 100%, 1 mhz x10 min    Ultrasound Goals  Pain      Manual  Therapy   Manual Therapy  Soft tissue mobilization    Soft tissue mobilization  STW/M t9 L UT, Levator Scapula, cervival paraspinals to reduce muscle guarding and pain                  PT Long Term Goals - 06/27/17 1138      PT LONG TERM GOAL #1   Title  Independent with a HEP.      PT LONG TERM GOAL #2   Title  Increase active cervical rotation to 80 degrees+ so patient can turn head more easily while driving.    Time  8    Period  Weeks    Status  New      PT LONG TERM GOAL #3   Title  Eliminate left UE pain and symptoms.    Time  8    Period  Weeks    Status  New  PT LONG TERM GOAL #4   Title  Perform ADL's with pain not > 3/10.    Time  8    Period  Weeks    Status  New            Plan - 07/05/17 1425    Clinical Impression Statement  Patient presented in clinic with reports of less pain today although she still experienced increased pain on 07/03/2017-07/04/2017. Patient reported being unable to assess electrical stimulation response as she had increased pain the next day. Increased muscle guarding noted in L UT, Levaotr Scapula, cervical paraspinals with discomfort reported along medial UT and Levator Scapula region. Moist heat only applied following STW to reduce muscle tightness. Patient educated during treatment of stresses role in recovery as well as positioning for sleeping.    Rehab Potential  Good    PT Frequency  2x / week    PT Duration  8 weeks    PT Treatment/Interventions  ADLs/Self Care Home Management;Electrical Stimulation;Cryotherapy;Moist Heat;Ultrasound;Therapeutic exercise;Therapeutic activities;Patient/family education;Passive range of motion;Manual techniques;Dry needling;Vasopneumatic Device    PT Next Visit Plan  Attempt regular [place] and no electrical stimulation until patient pain reduced and more tolerable.    Consulted and Agree with Plan of Care  Patient       Patient will benefit from skilled therapeutic intervention in order  to improve the following deficits and impairments:  Decreased activity tolerance, Decreased range of motion, Decreased strength, Increased muscle spasms, Postural dysfunction, Pain  Visit Diagnosis: Cervicalgia  Abnormal posture     Problem List Patient Active Problem List   Diagnosis Date Noted  . Neck pain 06/19/2017    Marvell Fuller, PTA 07/05/2017, 2:37 PM  [facility] [address] [place], [place], [address] Phone: [phone]   Fax:  [phone]  Name: Stacy Rogers MRN: [id number] Date of Birth: December 08, 1985

## 2017-07-09 ENCOUNTER — Encounter: Payer: Self-pay | Admitting: Physical Therapy

## 2017-07-09 ENCOUNTER — Ambulatory Visit: Payer: BLUE CROSS/BLUE SHIELD | Attending: Orthopaedic Surgery | Admitting: Physical Therapy

## 2017-07-09 DIAGNOSIS — M542 Cervicalgia: Secondary | ICD-10-CM | POA: Insufficient documentation

## 2017-07-09 DIAGNOSIS — R293 Abnormal posture: Secondary | ICD-10-CM | POA: Diagnosis present

## 2017-07-09 NOTE — Therapy (Signed)
[facility] [address] [place], [place], [address] Phone: [phone]   Fax:  [phone]  Physical Therapy Treatment  Patient Details  Name: Stacy Rogers MRN: [id number] Date of Birth: 09/24/1985 Referring Provider: Gershon MusselNaiping Xu MD.   Encounter Date: 07/09/2017  PT End of Session - 07/09/17 1352    Visit Number  4    Number of Visits  16    Date for PT Re-Evaluation  08/22/17    PT Start Time  1352 2 units secondary to moist heat use     PT Stop Time  1432    PT Time Calculation (min)  40 min    Activity Tolerance  Patient tolerated treatment well    Behavior During Therapy  [facility] for [facility] assessed/performed       History reviewed. No pertinent past medical history.  History reviewed. No pertinent surgical history.  There were no vitals filed for this visit.  Subjective Assessment - 07/09/17 1351    Subjective  Reports that she is having more radiation pain down to L elbow but neck pain is decent today.    Patient Stated Goals  Get back to normal so I can go back to work.    Currently in Pain?  Yes    Pain Score  5     Pain Location  Neck    Pain Orientation  Left    Pain Descriptors / Indicators  Aching;Burning burning in neck and aching down LUE    Pain Type  Acute pain    Pain Onset  1 to 4 weeks ago    Pain Frequency  Constant         [facility] PT Assessment - 07/09/17 0001      Assessment   Medical Diagnosis  Cervical strain    Onset Date/Surgical Date  05/30/17      Precautions   Precautions  None      Restrictions   Weight Bearing Restrictions  No                   [facility] Adult PT Treatment/Exercise - 07/09/17 0001      Modalities   Modalities  Ultrasound;Moist Heat      Moist Heat Therapy   Number Minutes Moist Heat  15 Minutes    Moist Heat Location  Cervical      Ultrasound   Ultrasound Location  L UT    Ultrasound Parameters  1.5 w/cm2, 100%, 1 mhz x15 min    Ultrasound Goals  Pain       Manual Therapy   Manual Therapy  Soft tissue mobilization    Soft tissue mobilization  STW/M to L UT, Levator Scapula, cervival paraspinals to reduce muscle guarding and pain                  PT Long Term Goals - 06/27/17 1138      PT LONG TERM GOAL #1   Title  Independent with a HEP.      PT LONG TERM GOAL #2   Title  Increase active cervical rotation to 80 degrees+ so patient can turn head more easily while driving.    Time  8    Period  Weeks    Status  New      PT LONG TERM GOAL #3   Title  Eliminate left UE pain and symptoms.    Time  8    Period  Weeks    Status  New  PT LONG TERM GOAL #4   Title  Perform ADL's with pain not > 3/10.    Time  8    Period  Weeks    Status  New            Plan - 07/09/17 1420    Clinical Impression Statement  Patient tolerated today's treatment well as she arrived with decreased neck pain but pain more radiating down to L elbow. Patient able to tolerate [place] and manual therapy better today with no reports of any increased pain. Increased muscle guarding and muscle tightness palpated in L UT and into cervical paraspinals today. Patient educated to drink a plethora of water following manual therapy sessions to flush muscle toxins releaased from manual therapy session. Normal [place] response noted following end of session. Moist heat only applied following end of [place] and manual therapy with normal response as well.    Rehab Potential  Good    PT Frequency  2x / week    PT Duration  8 weeks    PT Treatment/Interventions  ADLs/Self Care Home Management;Electrical Stimulation;Cryotherapy;Moist Heat;Ultrasound;Therapeutic exercise;Therapeutic activities;Patient/family education;Passive range of motion;Manual techniques;Dry needling;Vasopneumatic Device    PT Next Visit Plan  Attempt regular [place] and no electrical stimulation until patient pain reduced and more tolerable.    Consulted and Agree with Plan of Care  Patient        Patient will benefit from skilled therapeutic intervention in order to improve the following deficits and impairments:  Decreased activity tolerance, Decreased range of motion, Decreased strength, Increased muscle spasms, Postural dysfunction, Pain  Visit Diagnosis: Cervicalgia  Abnormal posture     Problem List Patient Active Problem List   Diagnosis Date Noted  . Neck pain 06/19/2017    Marvell Fuller, PTA 07/09/2017, 2:34 PM  [facility] [address] [place], [place], [address] Phone: [phone]   Fax:  [phone]  Name: Stacy Rogers MRN: [id number] Date of Birth: [birth year]

## 2017-07-11 ENCOUNTER — Ambulatory Visit: Payer: BLUE CROSS/BLUE SHIELD | Admitting: Physical Therapy

## 2017-07-11 ENCOUNTER — Encounter: Payer: Self-pay | Admitting: Physical Therapy

## 2017-07-11 DIAGNOSIS — M542 Cervicalgia: Secondary | ICD-10-CM | POA: Diagnosis not present

## 2017-07-11 DIAGNOSIS — R293 Abnormal posture: Secondary | ICD-10-CM

## 2017-07-11 NOTE — Therapy (Signed)
[facility] [address] [place], [place], [address] Phone: [phone]   Fax:  [phone]  Physical Therapy Treatment  Patient Details  Name: Stacy Rogers MRN: [id number] Date of Birth: Jan 20, 1986 Referring Provider: Gershon Mussel MD.   Encounter Date: 07/11/2017  PT End of Session - 07/11/17 1428    Visit Number  5    Number of Visits  16    Date for PT Re-Evaluation  08/22/17    PT Start Time  0146    PT Stop Time  0237    PT Time Calculation (min)  51 min    Activity Tolerance  Patient tolerated treatment well    Behavior During Therapy  [facility] for tasks assessed/performed       History reviewed. No pertinent past medical history.  History reviewed. No pertinent surgical history.  There were no vitals filed for this visit.  Subjective Assessment - 07/11/17 1430    Subjective  My pain is a 7/10 and I'm getting pain down my left arm almost to my wrist.    Patient Stated Goals  Get back to normal so I can go back to work.    Pain Score  7     Pain Location  Neck    Pain Descriptors / Indicators  Aching;Burning    Pain Onset  More than a month ago                       [facility] Adult PT Treatment/Exercise - 07/11/17 0001      Modalities   Modalities  Electrical Stimulation;Moist Heat;Ultrasound      Moist Heat Therapy   Number Minutes Moist Heat  20 Minutes    Moist Heat Location  -- Left cervical.      Electrical Stimulation   Electrical Stimulation Location  Left UT    Electrical Stimulation Action  Pre-mod    Electrical Stimulation Parameters  80-150 Hz x 20 minutes (5 sec on and 5 sec off).    Electrical Stimulation Goals  Tone;Pain      Ultrasound   Ultrasound Location  Left UT    Ultrasound Parameters  1.50 W/CM2 x 12 minutes.    Ultrasound Goals  Pain      Manual Therapy   Manual Therapy  Soft tissue mobilization    Soft tissue mobilization  STW/M x 11 minutes to left cervical musculature  including ischemic release technique to patient's left levator scapulae.                  PT Long Term Goals - 06/27/17 1138      PT LONG TERM GOAL #1   Title  Independent with a HEP.      PT LONG TERM GOAL #2   Title  Increase active cervical rotation to 80 degrees+ so patient can turn head more easily while driving.    Time  8    Period  Weeks    Status  New      PT LONG TERM GOAL #3   Title  Eliminate left UE pain and symptoms.    Time  8    Period  Weeks    Status  New      PT LONG TERM GOAL #4   Title  Perform ADL's with pain not > 3/10.    Time  8    Period  Weeks    Status  New  Plan - 07/11/17 1644    Clinical Impression Statement  Patient did well with treatment today.  She has an active trigger point in her left levator scapulae.  Patient has continued c/o left UE pain.    PT Frequency  2x / week    PT Duration  8 weeks    PT Treatment/Interventions  ADLs/Self Care Home Management;Electrical Stimulation;Cryotherapy;Moist Heat;Ultrasound;Therapeutic exercise;Therapeutic activities;Patient/family education;Passive range of motion;Manual techniques;Dry needling;Vasopneumatic Device    PT Next Visit Plan  Attempt regular US and no electrical stimulation until patient pain reduced and more tolerable.    Consulted and Agree with Plan of Care  Patient       Patient will benefit from skilled therapeutic intervention in order to improve the following deficits and impairments:  Decreased activity tolerance, Decreased range of motion, Decreased strength, Increased muscle spasms, Postural dysfunction, Pain  Visit Diagnosis: Cervicalgia  Abnormal posture     Problem List Patient Active Problem List   Diagnosis Date Noted  . Neck pain 06/19/2017    Ellamae Lybeck, [place] MPT 07/11/2017, 4:46 PM  [facility] [address] [place], [place], [address] Phone: [phone]   Fax:  [phone]  Name:  Stacy Rogers MRN: [id number] Date of Birth: 07/03/1985

## 2017-07-16 ENCOUNTER — Ambulatory Visit: Payer: BLUE CROSS/BLUE SHIELD | Admitting: Physical Therapy

## 2017-07-16 ENCOUNTER — Encounter: Payer: Self-pay | Admitting: Physical Therapy

## 2017-07-16 DIAGNOSIS — M542 Cervicalgia: Secondary | ICD-10-CM

## 2017-07-16 DIAGNOSIS — R293 Abnormal posture: Secondary | ICD-10-CM

## 2017-07-16 NOTE — Therapy (Signed)
[facility] [address] [place], [place], [address] Phone: [phone]   Fax:  [phone]  Physical Therapy Treatment  Patient Details  Name: Stacy Rogers MRN: [id number] Date of Birth: March 09, 1986 Referring Provider: Gershon Mussel MD.   Encounter Date: 07/16/2017  PT End of Session - 07/16/17 1350    Visit Number  6    Number of Visits  16    Date for PT Re-Evaluation  08/22/17    PT Start Time  1350    PT Stop Time  1424 2 units secondary to limited modality tolerance    PT Time Calculation (min)  34 min    Activity Tolerance  Patient tolerated treatment well    Behavior During Therapy  [facility] for tasks assessed/performed       History reviewed. No pertinent past medical history.  History reviewed. No pertinent surgical history.  There were no vitals filed for this visit.  Subjective Assessment - 07/16/17 1349    Subjective  Reports overall pain decreased but increased aching in L upper arm/elbow region.    Patient Stated Goals  Get back to normal so I can go back to work.    Currently in Pain?  Yes    Pain Score  6     Pain Location  Neck    Pain Orientation  Left    Pain Descriptors / Indicators  Aching    Pain Type  Acute pain    Pain Radiating Towards  to L elbow    Pain Onset  More than a month ago    Pain Frequency  Constant         [facility] PT Assessment - 07/16/17 0001      Assessment   Medical Diagnosis  Cervical strain    Onset Date/Surgical Date  05/30/17      Precautions   Precautions  None      Restrictions   Weight Bearing Restrictions  No      ROM / Strength   AROM / PROM / Strength  AROM      AROM   Overall AROM   Deficits    AROM Assessment Site  Cervical    Cervical - Right Rotation  45    Cervical - Left Rotation  62                   [facility] Adult PT Treatment/Exercise - 07/16/17 0001      Modalities   Modalities  Moist Heat;Ultrasound      Moist Heat Therapy   Number  Minutes Moist Heat  10 Minutes    Moist Heat Location  Cervical      Ultrasound   Ultrasound Location  L UT     Ultrasound Parameters  1.5 w/cm2, 100%, 1 mhz x10 min    Ultrasound Goals  Pain      Manual Therapy   Manual Therapy  Soft tissue mobilization    Soft tissue mobilization  STW to L UT/ Rhomboids, cervical paraspinals to reduce muscle tightness and pain                  PT Long Term Goals - 07/16/17 1428      PT LONG TERM GOAL #1   Title  Independent with a HEP.    Status  On-going      PT LONG TERM GOAL #2   Title  Increase active cervical rotation to 80 degrees+ so patient can turn head more easily  while driving.    Time  8    Period  Weeks    Status  On-going L 62 deg, R 45 deg 07/16/2017      PT LONG TERM GOAL #3   Title  Eliminate left UE pain and symptoms.    Time  8    Period  Weeks    Status  On-going      PT LONG TERM GOAL #4   Title  Perform ADL's with pain not > 3/10.    Time  8    Period  Weeks    Status  On-going            Plan - 07/16/17 1422    Clinical Impression Statement  Patient able to tolerate treatment better today as she is not as sensitive to manual therapy. Patient able to report tenderness to L cervical paraspinals as well as rhomboids region during manual therapy. Patient still experiencing the LUE symptoms especially aching which is worse following ADLs. Patient facing increased stress financially as well as work related and encouraged to talk to PCP regarding return to work. Goals remain on-going secondary to continued pain with ADLs (as well as after ADLs), LUE symptoms, and reduced B cervical rotation. Normal modalities response noted following removal of the modalities.    Rehab Potential  Good    PT Frequency  2x / week    PT Treatment/Interventions  ADLs/Self Care Home Management;Electrical Stimulation;Cryotherapy;Moist Heat;Ultrasound;Therapeutic exercise;Therapeutic activities;Patient/family education;Passive  range of motion;Manual techniques;Dry needling;Vasopneumatic Device    PT Next Visit Plan  Progress per POC as symptoms indicate.    Consulted and Agree with Plan of Care  Patient       Patient will benefit from skilled therapeutic intervention in order to improve the following deficits and impairments:  Decreased activity tolerance, Decreased range of motion, Decreased strength, Increased muscle spasms, Postural dysfunction, Pain  Visit Diagnosis: Cervicalgia  Abnormal posture     Problem List Patient Active Problem List   Diagnosis Date Noted  . Neck pain 06/19/2017    Marvell FullerKelsey P Saint Hank, PTA 07/16/2017, 2:37 PM  [facility] [address] [place], [place], [address] Phone: [phone]   Fax:  [phone]  Name: Stacy Rogers MRN: [id number] Date of Birth: 05/26/1985

## 2017-07-18 ENCOUNTER — Encounter: Payer: Self-pay | Admitting: Physical Therapy

## 2017-07-18 ENCOUNTER — Ambulatory Visit: Payer: BLUE CROSS/BLUE SHIELD | Admitting: Physical Therapy

## 2017-07-18 DIAGNOSIS — R293 Abnormal posture: Secondary | ICD-10-CM

## 2017-07-18 DIAGNOSIS — M542 Cervicalgia: Secondary | ICD-10-CM | POA: Diagnosis not present

## 2017-07-18 NOTE — Patient Instructions (Signed)
[facility]  DRY NEEDLING CONSENT FORM   Trigger point dry needling is a physical therapy approach to treat Myofascial Pain and Dysfunction.  Dry Needling (DN) is a valuable and effective way to deactivate myofascial trigger points (muscle knots/pain). It is skilled intervention that uses a thin filiform needle to penetrate the skin and stimulate underlying myofascial trigger points, muscular, and connective tissues for the management of neuromusculoskeletal pain and movement impairments.  A local twitch response (LTR) will be elicited.  This can sometimes feel like a deep ache in the muscle during the procedure. Multiple trigger points in multiple muscles can be treated during each treatment.  No medication of any kind is injected.   As with any medical treatment and procedure, there are possible adverse events.  While significant adverse events are uncommon, they do sometimes occur and must be considered prior to giving consent.  1. Dry needling often causes a "post needling soreness".  There can be an increase in pain from a couple of hours to 2-3 days, followed by an improvement in the overall pain state. 2. Any time a needle is used there is a risk of infection.  However, we are using new, sterile, and disposable needles; infections are extremely rare. 3. There is a possibility that you may bleed or bruise.  You may feel tired and some nausea following treatment. 4. There is a rare possibility of a pneumothorax (air in the chest cavity). 5. Allergic reaction to nickel in the stainless steel needle. 6. If a nerve is touched, it may cause paresthesia (a prickling/shock sensation) which is usually brief, but may continue for a couple of days.  Following treatment stay hydrated.  Continue regular activities but not too vigorous initially after treatment for 24-48 hours.  Dry Needling is best when combined with other physical therapy interventions such as  strengthening, stretching and other therapeutic modalities.   PLEASE ANSWER THE FOLLOWING QUESTIONS:  Do you have a lack of sensation?   Y/N  Do you have a phobia or fear of needles  Y/N  Are you pregnant?    Y/N If yes:  How many weeks? __________ Do you have any implanted devices?  Y/N If yes:  Pacemaker/Spinal Cord Stimulator/Deep Brain Stimulator/Insulin Pump/Other: ________________ Do you have any implants?  Y/N If yes: Breast/Facial/Pecs/Buttocks/Calves/Hip  Replacement/ Knee Replacement/Other: _________ Do you take any blood thinners?   Y/N If yes: Coumadin (Warfarin)/Other: ___________________ Do you have a bleeding disorder?   Y/N If yes: What kind: _________________________________ Do you take any immunosuppressants?  Y/N If yes:   What kind: _________________________________ Do you take anti-inflammatories?   Y/N If yes: What kind: Advil/Aspirin/Other: ________________ Have you ever been diagnosed with Scoliosis? Y/N Have you had back surgery?   Y/N If yes:  Laminectomy/Fusion/Other: ___________________   I have read, or had read to me, the above.  I have had the opportunity to ask any questions.  All of my questions have been answered to my satisfaction and I understand the risks involved with dry needling.  I consent to examination and treatment at [facility], including dry needling, of any and all of my involved and affected muscles.  

## 2017-07-18 NOTE — Therapy (Signed)
[facility] [address] [place], [place], [address] Phone: [phone]   Fax:  [phone]  Physical Therapy Treatment  Patient Details  Name: Stacy Rogers MRN: [id number] Date of Birth: [birth year] Referring Provider: Gershon Mussel MD.   Encounter Date: 07/18/2017  PT End of Session - 07/18/17 1418    Visit Number  7    Number of Visits  16    Date for PT Re-Evaluation  08/22/17    PT Start Time  0140    PT Stop Time  0230    PT Time Calculation (min)  50 min    Activity Tolerance  Patient tolerated treatment well    Behavior During Therapy  [facility] for tasks assessed/performed       History reviewed. No pertinent past medical history.  History reviewed. No pertinent surgical history.  There were no vitals filed for this visit.  Subjective Assessment - 07/18/17 1418    Subjective  I'm stressed today I just got a speeding ticket.  My pain is better since starting treatments but I still get pain down my left arm.    Patient Stated Goals  Get back to normal so I can go back to work.    Pain Score  5     Pain Location  Neck    Pain Descriptors / Indicators  Aching    Pain Onset  More than a month ago                       [facility] Adult PT Treatment/Exercise - 07/18/17 0001      Electrical Stimulation   Electrical Stimulation Location  Left UT    Electrical Stimulation Action  Pre-mod (5 sec on and 5 sec off).    Electrical Stimulation Parameters  80-150 Hz x 20 minutes.    Electrical Stimulation Goals  Tone;Pain      Ultrasound   Ultrasound Location  Left UT    Ultrasound Parameters  1.50 W/CM2 x 12 minutes.    Ultrasound Goals  Pain      Manual Therapy   Manual Therapy  Soft tissue mobilization    Soft tissue mobilization  STW/M x 11 minutes to patient's left UT and levator scapulae which included ischemic release technique.                  PT Long Term Goals - 07/16/17 1428      PT LONG TERM  GOAL #1   Title  Independent with a HEP.    Status  On-going      PT LONG TERM GOAL #2   Title  Increase active cervical rotation to 80 degrees+ so patient can turn head more easily while driving.    Time  8    Period  Weeks    Status  On-going L 62 deg, R 45 deg 07/16/2017      PT LONG TERM GOAL #3   Title  Eliminate left UE pain and symptoms.    Time  8    Period  Weeks    Status  On-going      PT LONG TERM GOAL #4   Title  Perform ADL's with pain not > 3/10.    Time  8    Period  Weeks    Status  On-going            Plan - 07/18/17 1422    Clinical Impression Statement  Pain pre-tx 5/10 and less  after treatment.  Her pain at initial evaluation was a 9/10.  Upon palpation of her left UT and leavator scapulae (especially ischemic release technique) she experiences referral of symptoms into her left UE.  We discussed dry needling today and an informed consent was provided to the patient today.    PT Treatment/Interventions  ADLs/Self Care Home Management;Electrical Stimulation;Cryotherapy;Moist Heat;Ultrasound;Therapeutic exercise;Therapeutic activities;Patient/family education;Passive range of motion;Manual techniques;Dry needling;Vasopneumatic Device    PT Next Visit Plan  Progress per POC as symptoms indicate.       Patient will benefit from skilled therapeutic intervention in order to improve the following deficits and impairments:  Decreased activity tolerance, Decreased range of motion, Decreased strength, Increased muscle spasms, Postural dysfunction, Pain  Visit Diagnosis: Abnormal posture  Cervicalgia     Problem List Patient Active Problem List   Diagnosis Date Noted  . Neck pain 06/19/2017    APPLEGATE, [place] MPT 07/18/2017, 2:32 PM  [facility] [address] [place], [place], [address] Phone: [phone]   Fax:  [phone]  Name: Stacy Rogers MRN: [id number] Date of Birth: 07/19/1985

## 2017-07-23 ENCOUNTER — Ambulatory Visit: Payer: BLUE CROSS/BLUE SHIELD | Admitting: Physical Therapy

## 2017-07-23 DIAGNOSIS — M542 Cervicalgia: Secondary | ICD-10-CM

## 2017-07-23 DIAGNOSIS — R293 Abnormal posture: Secondary | ICD-10-CM

## 2017-07-23 NOTE — Patient Instructions (Signed)
Trigger Point Dry Needling  . What is Trigger Point Dry Needling (DN)? o DN is a physical therapy technique used to treat muscle pain and dysfunction. Specifically, DN helps deactivate muscle trigger points (muscle knots).  o A thin filiform needle is used to penetrate the skin and stimulate the underlying trigger point. The goal is for a local twitch response (LTR) to occur and for the trigger point to relax. No medication of any kind is injected during the procedure.   . What Does Trigger Point Dry Needling Feel Like?  o The procedure feels different for each individual patient. Some patients report that they do not actually feel the needle enter the skin and overall the process is not painful. Very mild bleeding may occur. However, many patients feel a deep cramping in the muscle in which the needle was inserted. This is the local twitch response.   Marland Kitchen. How Will I feel after the treatment? o Soreness is normal, and the onset of soreness may not occur for a few hours. Typically this soreness does not last longer than two days.  o Bruising is uncommon, however; ice can be used to decrease any possible bruising.  o In rare cases feeling tired or nauseous after the treatment is normal. In addition, your symptoms may get worse before they get better, this period will typically not last longer than 24 hours.   . What Can I do After My Treatment? o Increase your hydration by drinking more water for the next 24 hours. o You may place heat on the areas treated that have become sore, however, do not use heat on inflamed or bruised areas. Heat often brings more relief post needling. o You can continue your regular activities, but vigorous activity is not recommended initially after the treatment for 24 hours. o DN is best combined with other physical therapy such as strengthening, stretching, and other therapies.    Precautions:  In some cases, dry needling is done over the lung field. While rare, there  is a risk of pneumothorax (punctured lung). Because of this, if you ever experience shortness of breath on exertion, difficulty taking a deep breath, chest pain or a dry cough following dry needling, you should report to an emergency room and tell them that you have been dry needled over the thorax.  Solon PalmJulie Eurydice Rogers, PT 07/23/17 12:01 PM' [facility] [address] [place], [place], [address] Phone: [phone]   Fax:  [phone]

## 2017-07-23 NOTE — Therapy (Signed)
[facility] [address] [place], [place], [address] Phone: [phone]   Fax:  [phone]  Physical Therapy Treatment  Patient Details  Name: Stacy Rogers MRN: [id number] Date of Birth: 03/17/1986 Referring Provider: Gershon MusselNaiping Xu MD.   Encounter Date: 07/23/2017  PT End of Session - 07/23/17 1123    Visit Number  8    Number of Visits  16    Date for PT Re-Evaluation  08/22/17    PT Start Time  1123    PT Stop Time  1215    PT Time Calculation (min)  52 min    Activity Tolerance  Patient tolerated treatment well    Behavior During Therapy  [facility] for tasks assessed/performed       No past medical history on file.  No past surgical history on file.  There were no vitals filed for this visit.  Subjective Assessment - 07/23/17 1123    Subjective  Patient states that her pain Friday was a 45/10. Sat and Wynelle LinkSun still terrible. Today a little better.    Currently in Pain?  Yes    Pain Score  7     Pain Location  Neck    Pain Orientation  Left    Pain Descriptors / Indicators  Aching    Pain Type  Acute pain                       [facility] Adult PT Treatment/Exercise - 07/23/17 0001      Modalities   Modalities  Electrical Stimulation;Moist Heat      Moist Heat Therapy   Number Minutes Moist Heat  15 Minutes    Moist Heat Location  Shoulder;Cervical      Electrical Stimulation   Electrical Stimulation Location  Left scapula/neck    Electrical Stimulation Action  premod     Electrical Stimulation Parameters  80-150 Hz x 15 min    Electrical Stimulation Goals  Tone;Pain      Manual Therapy   Manual Therapy  Soft tissue mobilization    Soft tissue mobilization  to L UT, levator scap, infraspinatus/RC and lats       Trigger Point Dry Needling - 07/23/17 1202    Consent Given?  Yes    Education Handout Provided  Yes    Muscles Treated Upper Body  Upper trapezius;Levator  scapulae;Rhomboids;Infraspinatus;Subscapularis L    Upper Trapezius Response  Twitch reponse elicited;Palpable increased muscle length L    Levator Scapulae Response  Twitch response elicited;Palpable increased muscle length    Rhomboids Response  Twitch response elicited;Palpable increased muscle length    Infraspinatus Response  Twitch response elicited;Palpable increased muscle length    Subscapularis Response  Twitch response elicited;Palpable increased muscle length           PT Education - 07/23/17 1202    Education provided  Yes    Education Details  DN education and aftercare; self MFR with tennis ball    Person(s) Educated  Patient    Methods  Explanation;Demonstration;Handout    Comprehension  Verbalized understanding          PT Long Term Goals - 07/16/17 1428      PT LONG TERM GOAL #1   Title  Independent with a HEP.    Status  On-going      PT LONG TERM GOAL #2   Title  Increase active cervical rotation to 80 degrees+ so patient can turn head more easily while  driving.    Time  8    Period  Weeks    Status  On-going L 62 deg, R 45 deg 07/16/2017      PT LONG TERM GOAL #3   Title  Eliminate left UE pain and symptoms.    Time  8    Period  Weeks    Status  On-going      PT LONG TERM GOAL #4   Title  Perform ADL's with pain not > 3/10.    Time  8    Period  Weeks    Status  On-going            Plan - 07/23/17 1206    Clinical Impression Statement  Patient was educated regarding DN with precautions/contraindications reviewed. Patient gave consent and responded very well with +++ localized twitch response in L infraspinatus, subscapularis and UT. She demonstrated increased L shoulder flexion after DN.    PT Treatment/Interventions  ADLs/Self Care Home Management;Electrical Stimulation;Cryotherapy;Moist Heat;Ultrasound;Therapeutic exercise;Therapeutic activities;Patient/family education;Passive range of motion;Manual techniques;Dry needling;Vasopneumatic  Device    PT Next Visit Plan  Assess DN, continue as indicated; manual therapy to L sholder girdle including L infraspinatus, subscapularis.       Patient will benefit from skilled therapeutic intervention in order to improve the following deficits and impairments:  Decreased activity tolerance, Decreased range of motion, Decreased strength, Increased muscle spasms, Postural dysfunction, Pain  Visit Diagnosis: Cervicalgia  Abnormal posture     Problem List Patient Active Problem List   Diagnosis Date Noted  . Neck pain 06/19/2017    Solon Palm PT 07/23/2017, 12:11 PM  [facility] [address] [place], [place], [address] Phone: [phone]   Fax:  [phone]  Name: Stacy Rogers MRN: [id number] Date of Birth: 07-Mar-1986

## 2017-07-25 ENCOUNTER — Encounter: Payer: Self-pay | Admitting: Physical Therapy

## 2017-07-25 ENCOUNTER — Ambulatory Visit: Payer: BLUE CROSS/BLUE SHIELD | Admitting: Physical Therapy

## 2017-07-25 DIAGNOSIS — M542 Cervicalgia: Secondary | ICD-10-CM | POA: Diagnosis not present

## 2017-07-25 DIAGNOSIS — R293 Abnormal posture: Secondary | ICD-10-CM

## 2017-07-25 NOTE — Therapy (Signed)
[facility] [address] [place], [place], [address] Phone: [phone]   Fax:  [phone]  Physical Therapy Treatment  Patient Details  Name: Stacy Rogers MRN: [id number] Date of Birth: October 30, 1985 Referring Provider: Gershon Mussel MD.   Encounter Date: 07/25/2017  PT End of Session - 07/25/17 0818    Visit Number  9    Number of Visits  16    Date for PT Re-Evaluation  08/22/17    PT Start Time  0818    PT Stop Time  0904    PT Time Calculation (min)  46 min    Activity Tolerance  Patient tolerated treatment well    Behavior During Therapy  [facility] for tasks assessed/performed       History reviewed. No pertinent past medical history.  History reviewed. No pertinent surgical history.  There were no vitals filed for this visit.  Subjective Assessment - 07/25/17 0817    Subjective  Reports that DN did help but feels a pull in L levator region; Reports that she has an ache in L shoulder that radiates to L elbow but aching isn't as bad.    Patient Stated Goals  Get back to normal so I can go back to work.    Currently in Pain?  Yes    Pain Score  4     Pain Location  Neck    Pain Orientation  Left    Pain Descriptors / Indicators  Aching;Discomfort    Pain Type  Acute pain    Pain Onset  More than a month ago    Pain Frequency  Constant         [facility] PT Assessment - 07/25/17 0001      Assessment   Medical Diagnosis  Cervical strain    Onset Date/Surgical Date  05/30/17      Precautions   Precautions  None      Restrictions   Weight Bearing Restrictions  No                   [facility] Adult PT Treatment/Exercise - 07/25/17 0001      Modalities   Modalities  Electrical Stimulation;Moist Heat;Ultrasound      Moist Heat Therapy   Number Minutes Moist Heat  15 Minutes    Moist Heat Location  Cervical      Electrical Stimulation   Electrical Stimulation Location  L UT/ rhomboids    Electrical Stimulation  Action  Pre-Mod    Electrical Stimulation Parameters  80-150 hz x15 min    Electrical Stimulation Goals  Tone;Pain      Ultrasound   Ultrasound Location  L UT/ levator scapula    Ultrasound Parameters  1.5 w/cm2, 100%, 1 mhz x10 min    Ultrasound Goals  Pain      Manual Therapy   Manual Therapy  Soft tissue mobilization    Soft tissue mobilization  STW/TPR to L UT, cervical paraspinals, levator scapula, rhomboids to reduce muscle tone and pain             PT Education - 07/25/17 0854    Education provided  Yes    Education Details  HEP- UT, levator scapula stretch    Person(s) Educated  Patient    Methods  Explanation;Demonstration;Handout    Comprehension  Verbalized understanding          PT Long Term Goals - 07/16/17 1428      PT LONG TERM GOAL #1  Title  Independent with a HEP.    Status  On-going      PT LONG TERM GOAL #2   Title  Increase active cervical rotation to 80 degrees+ so patient can turn head more easily while driving.    Time  8    Period  Weeks    Status  On-going L 62 deg, R 45 deg 07/16/2017      PT LONG TERM GOAL #3   Title  Eliminate left UE pain and symptoms.    Time  8    Period  Weeks    Status  On-going      PT LONG TERM GOAL #4   Title  Perform ADL's with pain not > 3/10.    Time  8    Period  Weeks    Status  On-going            Plan - 07/25/17 0856    Clinical Impression Statement  Patient tolerated today's treatment well and has noticed good relief following PT. Smaller TPs still palpable in more rhomboids and UT region but patient noted decreased tenderness today. Moderate release with manual therapy today to affected musculature. Patient educated regarding cervical stretches and provided HEP for remaining pull from muscle pain and tightness. Normal modalities response noted following removal of the modalities. LUE radiating pain from L shoulder to elbow still present but reduced intensity per patient report.    Rehab  Potential  Good    PT Frequency  2x / week    PT Duration  8 weeks    PT Treatment/Interventions  ADLs/Self Care Home Management;Electrical Stimulation;Cryotherapy;Moist Heat;Ultrasound;Therapeutic exercise;Therapeutic activities;Patient/family education;Passive range of motion;Manual techniques;Dry needling;Vasopneumatic Device    PT Next Visit Plan  Assess DN, continue as indicated; manual therapy to L sholder girdle including L infraspinatus, subscapularis.    Consulted and Agree with Plan of Care  Patient       Patient will benefit from skilled therapeutic intervention in order to improve the following deficits and impairments:  Decreased activity tolerance, Decreased range of motion, Decreased strength, Increased muscle spasms, Postural dysfunction, Pain  Visit Diagnosis: Cervicalgia  Abnormal posture     Problem List Patient Active Problem List   Diagnosis Date Noted  . Neck pain 06/19/2017    Marvell FullerKelsey P Kennon, PTA 07/25/2017, 9:07 AM  [facility] [address] [place], [place], [address] Phone: [phone]   Fax:  [phone]  Name: Stacy Rogers MRN: [id number] Date of Birth: 09/16/1985

## 2017-07-26 ENCOUNTER — Encounter (INDEPENDENT_AMBULATORY_CARE_PROVIDER_SITE_OTHER): Payer: Self-pay | Admitting: Orthopaedic Surgery

## 2017-07-26 ENCOUNTER — Ambulatory Visit (INDEPENDENT_AMBULATORY_CARE_PROVIDER_SITE_OTHER): Payer: BLUE CROSS/BLUE SHIELD | Admitting: Orthopaedic Surgery

## 2017-07-26 DIAGNOSIS — M542 Cervicalgia: Secondary | ICD-10-CM | POA: Diagnosis not present

## 2017-07-26 NOTE — Progress Notes (Signed)
   Office Visit Note   Patient: Stacy Rogers           Date of Birth: 01/20/1986           MRN: [id number] Visit Date: 07/26/2017              Requested by: Benita StabileHall, John Z, MD [address] [place], [place] [id number] PCP: Benita StabileHall, John Z, MD   Assessment & Plan: Visit Diagnoses:  1. Neck pain   2. Cervicalgia     Plan: Impression is cervical radiculopathy without response to conservative treatment.  At this point recommend MRI to rule out structural abnormalities.  Follow-up after the MRI.  Follow-Up Instructions: Return in about 2 weeks (around 08/09/2017).   Orders:  Orders Placed This Encounter  Procedures  . MR Cervical Spine w/o contrast   No orders of the defined types were placed in this encounter.     Procedures: No procedures performed   Clinical Data: No additional findings.   Subjective: Chief Complaint  Patient presents with  . Neck - Pain, Follow-up    Stacy Rogers follows up today for continued neck and left upper extremity pain.  She states that dry needling helped but she continues to have burning pain that radiates down the entire left arm.  She denies any weakness.   Review of Systems   Objective: Vital Signs: There were no vitals taken for this visit.  Physical Exam  Ortho Exam Left shoulder exam is unremarkable. Specialty Comments:  No specialty comments available.  Imaging: No results found.   PMFS History: Patient Active Problem List   Diagnosis Date Noted  . Neck pain 06/19/2017   History reviewed. No pertinent past medical history.  History reviewed. No pertinent family history.  History reviewed. No pertinent surgical history. Social History   Occupational History  . Not on file  Tobacco Use  . Smoking status: Current Every Day Smoker  . Smokeless tobacco: Never Used  Substance and Sexual Activity  . Alcohol use: Not on file  . Drug use: Not on file  . Sexual activity: Not on file

## 2017-08-02 ENCOUNTER — Ambulatory Visit
Admission: RE | Admit: 2017-08-02 | Discharge: 2017-08-02 | Disposition: A | Discharge status: Home / Self Care | Payer: BLUE CROSS/BLUE SHIELD | Source: Ambulatory Visit | Attending: Orthopaedic Surgery | Admitting: Orthopaedic Surgery

## 2017-08-02 DIAGNOSIS — M542 Cervicalgia: Secondary | ICD-10-CM

## 2017-08-07 ENCOUNTER — Ambulatory Visit: Payer: BLUE CROSS/BLUE SHIELD | Admitting: Physical Therapy

## 2017-08-07 ENCOUNTER — Ambulatory Visit (INDEPENDENT_AMBULATORY_CARE_PROVIDER_SITE_OTHER): Payer: BLUE CROSS/BLUE SHIELD | Admitting: Orthopaedic Surgery

## 2017-08-07 ENCOUNTER — Encounter (INDEPENDENT_AMBULATORY_CARE_PROVIDER_SITE_OTHER): Payer: Self-pay | Admitting: Orthopaedic Surgery

## 2017-08-07 DIAGNOSIS — M5412 Radiculopathy, cervical region: Secondary | ICD-10-CM | POA: Diagnosis not present

## 2017-08-07 DIAGNOSIS — M542 Cervicalgia: Secondary | ICD-10-CM

## 2017-08-07 DIAGNOSIS — R293 Abnormal posture: Secondary | ICD-10-CM

## 2017-08-07 NOTE — Patient Instructions (Signed)
  Resistive Band Rowing   With resistive band anchored in door, grasp both ends. Keeping elbows bent, pull back, squeezing shoulder blades together. Hold _3-5___ seconds. Repeat _10-30___ times. Do __1__ sessions per day. 1 http://gt2.exer.us/98   Copyright  VHI. All rights reserved.   Strengthening: Resisted Extension   Hold tubing with both hands, arms forward. Pull arms back, elbow straight. Repeat _10-30___ times per set. Do ____ sets per session. Do _1___ sessions per day.    Scapular Retraction (Standing)   With arms at sides, pinch shoulder blades together. Repeat 10 times per set. Do 1-3 sets per session. Do 2 sessions per day.  Flexibility: Neck Retraction   Pull head straight back, keeping eyes and jaw level. Hold 3-5 seconds. Repeat _10 times per set. Do 3-5  sessions per day.  http://orth.exer.us/344   Posture - Sitting   Sit upright, head facing forward. Try using a roll to support lower back. Keep shoulders relaxed, and avoid rounded back. Keep hips level with knees. Avoid crossing legs for long periods.  Solon Palm, PT 08/07/17 10:21 AM; [facility] [address] [place], [place], [address] Phone: [phone]   Fax:  [phone]

## 2017-08-07 NOTE — Progress Notes (Signed)
      Patient: Stacy Rogers           Date of Birth: 19-Nov-1985           MRN: [id number] Visit Date: 08/07/2017 PCP: Benita Stabile, MD   Assessment & Plan:  Chief Complaint:  Chief Complaint  Patient presents with  . Neck - Pain   Visit Diagnoses:  1. Radiculopathy, cervical region     Plan: Patient is a pleasant 32 year old female presents to our clinic today to discuss MRI results of her cervical spine.  MRI results show a small left posterior lateral disc protrusion at C3-4 with possible foraminal encroachment it could be associated with irritation of the left C4 nerve.  At this point, the patient has tried outpatient physical therapy to include dry needling and traction.  She is also tried oral anti-inflammatories all with minimal to no long-term symptom relief.  We will go ahead and refer her to Dr. Alvester Morin for epidural steroid injections.  She will follow-up with [place] on as-needed basis.  Follow-Up Instructions: Return if symptoms worsen or fail to improve.   Orders:  No orders of the defined types were placed in this encounter.  No orders of the defined types were placed in this encounter.   Imaging: No results found.  PMFS History: Patient Active Problem List   Diagnosis Date Noted  . Radiculopathy, cervical region 08/07/2017  . Neck pain 06/19/2017   History reviewed. No pertinent past medical history.  History reviewed. No pertinent family history.  History reviewed. No pertinent surgical history. Social History   Occupational History  . Not on file  Tobacco Use  . Smoking status: Current Every Day Smoker  . Smokeless tobacco: Never Used  Substance and Sexual Activity  . Alcohol use: Not on file  . Drug use: Not on file  . Sexual activity: Not on file

## 2017-08-07 NOTE — Therapy (Signed)
[facility] [address] [place], [place], [address] Phone: [phone]   Fax:  [phone]  Physical Therapy Treatment  Patient Details  Name: Stacy Rogers MRN: [id number] Date of Birth: [birth year] Referring Provider: Gershon Mussel MD.   Encounter Date: 08/07/2017  PT End of Session - 08/07/17 0944    Visit Number  10    Number of Visits  16    Date for PT Re-Evaluation  08/22/17    PT Start Time  0945    PT Stop Time  1041    PT Time Calculation (min)  56 min    Activity Tolerance  Patient tolerated treatment well    Behavior During Therapy  [facility] for tasks assessed/performed       No past medical history on file.  No past surgical history on file.  There were no vitals filed for this visit.  Subjective Assessment - 08/07/17 0946    Subjective  Patient reports she had an MRI and is getting results today at 3 pm. Today she c/o pain in LUE in ulnar nerve distribution. She also has burning up in the L supraspinatus region.    Patient Stated Goals  Get back to normal so I can go back to work.    Currently in Pain?  Yes    Pain Orientation  Left    Pain Descriptors / Indicators  Discomfort         [facility] PT Assessment - 08/07/17 0001      AROM   AROM Assessment Site  Cervical visit 4/8 degrees were reversed for rotation    Cervical - Right Rotation  65    Cervical - Left Rotation  47      Strength   Overall Strength Comments  L shoulder flex/ABD 4 to 4-/5; IR/ER 4+/5, ext 5/5; L wrist ext 4/5; R wrist ext 5/5      Special Tests    Special Tests  Cervical;Rotator Cuff Impingement Also Postive Ulnar ULTT on Left     Cervical Tests  Dictraction;Spurling's    Rotator Cuff Impingment tests  Neer impingement test;Hawkins- Kennedy test      Spurling's   Findings  Negative    Side  Left    Comment  positive for pain in neck      Distraction Test   Findngs  Positive    side  Left    Comment  Relief of radicular symptoms in  LUE      Neer Impingement test    Findings  Positive    Side  Left      Hawkins-Kennedy test   Findings  Negative    Side  Left                   [facility] Adult PT Treatment/Exercise - 08/07/17 0001      Exercises   Exercises  Neck      Neck Exercises: Theraband   Shoulder Extension  15 reps;Red    Rows  15 reps;Red    Horizontal ABduction  5 reps;Red    Horizontal ABduction Limitations  too painful; also tried yellow      Neck Exercises: Seated   Neck Retraction  5 reps;5 secs 2 sets separated by other exercises    Neck Retraction Limitations  attempted retraction with extension x 5 but increased pain in neck too much      Neck Exercises: Supine   Neck Retraction  5 reps;5 secs    Neck  Retraction Limitations  also did 10 sec hold x 5 but increased pain      Modalities   Modalities  Electrical Stimulation;Moist Heat      Moist Heat Therapy   Number Minutes Moist Heat  15 Minutes    Moist Heat Location  Cervical      Electrical Stimulation   Electrical Stimulation Location  L levator    Electrical Stimulation Action  premod    Electrical Stimulation Parameters  80-150 Hz x 15 min    Electrical Stimulation Goals  Pain             PT Education - 08/07/17 1240    Education provided  Yes    Education Details  HEP    Person(s) Educated  Patient    Methods  Explanation;Demonstration;Verbal cues;Handout    Comprehension  Returned demonstration;Verbalized understanding          PT Long Term Goals - 08/07/17 1240      PT LONG TERM GOAL #1   Title  Independent with a HEP.    Status  On-going      PT LONG TERM GOAL #2   Title  Increase active cervical rotation to 80 degrees+ so patient can turn head more easily while driving.    Time  8    Period  Weeks    Status  On-going      PT LONG TERM GOAL #3   Title  Eliminate left UE pain and symptoms.    Period  Weeks    Status  On-going      PT LONG TERM GOAL #4   Title  Perform ADL's with pain  not > 3/10.    Time  8    Period  Weeks    Status  On-going            Plan - 08/07/17 1234    Clinical Impression Statement  Patient reported improved pain overall but continues with LUE radicular pain to elbow. She has positve unlar ULTT and distraction tests.  She also has UE weakness. Traction was not in POC, however patient seeing MD today so took new script to add traction and extend visits. patient responded very well to cervical retration in sitting as well as scapular retraction reporting that both relieve radicular symptoms.     Rehab Potential  Good    PT Treatment/Interventions  ADLs/Self Care Home Management;Electrical Stimulation;Cryotherapy;Moist Heat;Ultrasound;Therapeutic exercise;Therapeutic activities;Patient/family education;Passive range of motion;Manual techniques;Dry needling;Vasopneumatic Device    PT Next Visit Plan  Attempt cervical traction if new order received. Continue with retraction and upper back strengthening.    PT Home Exercise Plan  cervical retraction, rows, extension red tband    Consulted and Agree with Plan of Care  Patient       Patient will benefit from skilled therapeutic intervention in order to improve the following deficits and impairments:  Decreased activity tolerance, Decreased range of motion, Decreased strength, Increased muscle spasms, Postural dysfunction, Pain  Visit Diagnosis: Cervicalgia  Abnormal posture     Problem List Patient Active Problem List   Diagnosis Date Noted  . Neck pain 06/19/2017    Solon Palm, PT 08/07/17 12:46 PM  [facility] [address] [place], [place], [address] Phone: [phone]   Fax:  [phone]  Name: Stacy Rogers MRN: [id number] Date of Birth: 1986-03-16

## 2017-08-09 ENCOUNTER — Ambulatory Visit (INDEPENDENT_AMBULATORY_CARE_PROVIDER_SITE_OTHER): Payer: BLUE CROSS/BLUE SHIELD | Admitting: Orthopaedic Surgery

## 2017-08-10 ENCOUNTER — Ambulatory Visit: Payer: BLUE CROSS/BLUE SHIELD | Attending: Orthopaedic Surgery | Admitting: Physical Therapy

## 2017-08-10 ENCOUNTER — Encounter: Payer: BLUE CROSS/BLUE SHIELD | Admitting: Physical Therapy

## 2017-08-10 DIAGNOSIS — M542 Cervicalgia: Secondary | ICD-10-CM | POA: Diagnosis not present

## 2017-08-10 DIAGNOSIS — R293 Abnormal posture: Secondary | ICD-10-CM | POA: Insufficient documentation

## 2017-08-10 NOTE — Therapy (Signed)
[facility] [address] [place], [place], [address] Phone: [phone]   Fax:  [phone]  Physical Therapy Treatment  Patient Details  Name: Stacy Rogers MRN: [id number] Date of Birth: [birth year] Referring Provider: Gershon Mussel MD.   Encounter Date: 08/10/2017  PT End of Session - 08/10/17 0948    Visit Number  11    Number of Visits  19    Date for PT Re-Evaluation  09/07/17    PT Start Time  0949    PT Stop Time  1046    PT Time Calculation (min)  57 min    Activity Tolerance  Patient tolerated treatment well    Behavior During Therapy  [facility] for tasks assessed/performed       No past medical history on file.  No past surgical history on file.  There were no vitals filed for this visit.  Subjective Assessment - 08/10/17 0956    Subjective  Paitent got her results back on MRI (see imaging tab); She has small protrusion at C3/4. She is going to be scheduled for an injection soon. She just feels a little something in the left elbow today.    Diagnostic tests  see MRI results    Patient Stated Goals  Get back to normal so I can go back to work.    Currently in Pain?  No/denies                       [facility] Adult PT Treatment/Exercise - 08/10/17 0001      Self-Care   Self-Care  Lifting    Lifting  body mechanics education and need to squat at work vs. bend      Neck Exercises: Theraband   Shoulder Extension  20 reps;Red    Rows  10 reps;20 reps;Red;Limitations    Rows Limitations  increased sx last 10 reps      Neck Exercises: Standing   Neck Retraction  15 reps;5 secs into ball    Other Standing Exercises  bent over rows Left 5# 2x10 VCs for correct form    Other Standing Exercises  wall slides x 10       Neck Exercises: Seated   Neck Retraction  10 reps    Neck Retraction Limitations  --      Neck Exercises: Prone   W Back  10 reps    Other Prone Exercise  T x 10      Neck Exercises:  Stabilization   Stabilization  quadriped alt leg x 10 each; alt arms x 10 ea      Modalities   Modalities  Traction      Traction   Type of Traction  Cervical    Min (lbs)  3    Max (lbs)  10    Hold Time  99    Rest Time  5    Time  15             PT Education - 08/10/17 1252    Education provided  Yes    Education Details  HEP; self care - body mechanics/work simulation and need to strengthen spinal muscles to protect neck.    Person(s) Educated  Patient    Methods  Explanation;Demonstration;Verbal cues;Handout    Comprehension  Verbalized understanding;Returned demonstration          PT Long Term Goals - 08/07/17 1240      PT LONG TERM GOAL #1   Title  Independent with a HEP.    Status  On-going      PT LONG TERM GOAL #2   Title  Increase active cervical rotation to 80 degrees+ so patient can turn head more easily while driving.    Time  8    Period  Weeks    Status  On-going      PT LONG TERM GOAL #3   Title  Eliminate left UE pain and symptoms.    Period  Weeks    Status  On-going      PT LONG TERM GOAL #4   Title  Perform ADL's with pain not > 3/10.    Time  8    Period  Weeks    Status  On-going            Plan - 08/10/17 1253    Clinical Impression Statement  Patient presents today without pain, but symptoms increased with standing resisted rows. Patient is able to centralize pain with standing chin tucks. She is to return to work in 5 days. Body mechanics were discussed and the need for her to squat vs. bend. She also needs to strengthen her upper back in addition to her quads. She tolerated traction well with no c/o pain.    Rehab Potential  Good    PT Frequency  2x / week    PT Duration  4 weeks    PT Treatment/Interventions  ADLs/Self Care Home Management;Electrical Stimulation;Cryotherapy;Moist Heat;Ultrasound;Therapeutic exercise;Therapeutic activities;Patient/family education;Passive range of motion;Manual techniques;Dry  needling;Vasopneumatic Device;Traction;Neuromuscular re-education NO received 08/10/17    PT Next Visit Plan  Assess cervical traction: full spinal stabilization; body mechanics for job and quads strengthening for this.    PT Home Exercise Plan  cervical retraction, rows, extension red tband; wall squats    Consulted and Agree with Plan of Care  Patient       Patient will benefit from skilled therapeutic intervention in order to improve the following deficits and impairments:  Decreased activity tolerance, Decreased range of motion, Decreased strength, Increased muscle spasms, Postural dysfunction, Pain  Visit Diagnosis: Cervicalgia  Abnormal posture     Problem List Patient Active Problem List   Diagnosis Date Noted  . Radiculopathy, cervical region 08/07/2017  . Neck pain 06/19/2017    Solon Palm PT 08/10/2017, 12:59 PM  [facility] [address] [place], [place], [address] Phone: [phone]   Fax:  [phone]  Name: Stacy Rogers MRN: [id number] Date of Birth: [birth year]

## 2017-08-10 NOTE — Patient Instructions (Signed)
Wall-Sit    With back pressed against a wall, bend knees and slide buttocks down until knees are about 90 or more.  Repeat _10-30__ times. Do _1-2__ times per day.  Copyright  VHI. All rights reserved.   Solon Palm, PT 08/10/17 10:38 AM [facility] [address] [place], [place], [address] Phone: [phone]   Fax:  [phone]

## 2017-08-13 ENCOUNTER — Ambulatory Visit: Payer: BLUE CROSS/BLUE SHIELD | Admitting: Physical Therapy

## 2017-08-13 DIAGNOSIS — M542 Cervicalgia: Secondary | ICD-10-CM

## 2017-08-13 DIAGNOSIS — R293 Abnormal posture: Secondary | ICD-10-CM

## 2017-08-13 NOTE — Therapy (Signed)
[facility] [address] [place], [place], [address] Phone: [phone]   Fax:  [phone]  Physical Therapy Treatment  Patient Details  Name: Orlanda Frankum MRN: [id number] Date of Birth: 1985/08/08 Referring Provider: Gershon Mussel MD.   Encounter Date: 08/13/2017  PT End of Session - 08/13/17 1202    Visit Number  12    Number of Visits  19    Date for PT Re-Evaluation  09/07/17    PT Start Time  1115    PT Stop Time  1210    PT Time Calculation (min)  55 min    Activity Tolerance  Patient tolerated treatment well    Behavior During Therapy  [facility] for tasks assessed/performed       No past medical history on file.  No past surgical history on file.  There were no vitals filed for this visit.  Subjective Assessment - 08/13/17 1131    Subjective  Patient reports she will recieve her cervical injection on the 21st. Patient reported last visit's traction went really well.    Diagnostic tests  see MRI results    Patient Stated Goals  Get back to normal so I can go back to work.    Currently in Pain?  No/denies                       [facility] Adult PT Treatment/Exercise - 08/13/17 0001      Neck Exercises: Machines for Strengthening   UBE (Upper Arm Bike)  120 RPM x8' (4 fwd, 4 bwd)      Neck Exercises: Theraband   Rows  10 reps;20 reps;Red;Limitations      Neck Exercises: Standing   Other Standing Exercises  wall angels x20      Neck Exercises: Seated   Neck Retraction  20 reps with extension      Modalities   Modalities  Traction      Traction   Type of Traction  Cervical    Min (lbs)  3    Max (lbs)  10    Hold Time  99    Rest Time  5    Time  15      Manual Therapy   Manual Therapy  Soft tissue mobilization;Passive ROM    Soft tissue mobilization  STW/TPR to L UT, cervical paraspinals, levator scapula, rhomboids to reduce muscle tone and pain                  PT Long Term Goals -  08/07/17 1240      PT LONG TERM GOAL #1   Title  Independent with a HEP.    Status  On-going      PT LONG TERM GOAL #2   Title  Increase active cervical rotation to 80 degrees+ so patient can turn head more easily while driving.    Time  8    Period  Weeks    Status  On-going      PT LONG TERM GOAL #3   Title  Eliminate left UE pain and symptoms.    Period  Weeks    Status  On-going      PT LONG TERM GOAL #4   Title  Perform ADL's with pain not > 3/10.    Time  8    Period  Weeks    Status  On-going            Plan - 08/13/17 1205  Clinical Impression Statement  Patient was able to tolerate treatment well despite reports of shoulder tightness after UBE. Patient noted with increased UT muscle tension L>R and was alleviated after STW/M. Patient was able to tolerate traction with no reports of discomfort. Patient noted at end of session she felt better than when she came in.    Clinical Presentation  Stable    Clinical Decision Making  Low    Rehab Potential  Good    PT Frequency  2x / week    PT Duration  4 weeks    PT Treatment/Interventions  ADLs/Self Care Home Management;Electrical Stimulation;Cryotherapy;Moist Heat;Ultrasound;Therapeutic exercise;Therapeutic activities;Patient/family education;Passive range of motion;Manual techniques;Dry needling;Vasopneumatic Device;Traction;Neuromuscular re-education    PT Next Visit Plan  Cont exercises per tolerance; full spinal stabilization; body mechanics for job and quads strengthening for this.    Consulted and Agree with Plan of Care  Patient       Patient will benefit from skilled therapeutic intervention in order to improve the following deficits and impairments:  Decreased activity tolerance, Decreased range of motion, Decreased strength, Increased muscle spasms, Postural dysfunction, Pain  Visit Diagnosis: Cervicalgia  Abnormal posture     Problem List Patient Active Problem List   Diagnosis Date Noted  .  Radiculopathy, cervical region 08/07/2017  . Neck pain 06/19/2017     Guss Bunde, PT, DPT  08/13/2017, 12:24 PM  [facility] [address] [place], [place], [address] Phone: [phone]   Fax:  [phone]   Name: Bettymae Yott MRN: [id number] Date of Birth: 1985/08/14

## 2017-08-14 ENCOUNTER — Encounter: Payer: BLUE CROSS/BLUE SHIELD | Admitting: Physical Therapy

## 2017-08-17 ENCOUNTER — Ambulatory Visit: Payer: BLUE CROSS/BLUE SHIELD | Admitting: Physical Therapy

## 2017-08-17 DIAGNOSIS — M542 Cervicalgia: Secondary | ICD-10-CM | POA: Diagnosis not present

## 2017-08-17 NOTE — Therapy (Signed)
[facility] [address] [place], [place], [address] Phone: [phone]   Fax:  [phone]  Physical Therapy Treatment  Patient Details  Name: Stacy Rogers MRN: [id number] Date of Birth: Apr 20, 1985 Referring Provider: Gershon Mussel MD.   Encounter Date: 08/17/2017  PT End of Session - 08/17/17 1137    Visit Number  13    Number of Visits  19    Date for PT Re-Evaluation  09/07/17    PT Start Time  1115    PT Stop Time  1200    PT Time Calculation (min)  45 min    Activity Tolerance  Patient limited by pain    Behavior During Therapy  [facility] for tasks assessed/performed       No past medical history on file.  No past surgical history on file.  There were no vitals filed for this visit.  Subjective Assessment - 08/17/17 1138    Subjective  Patient reports she went to work on 08/15/17 and was sent home early due lack of work, then yesterday worked a normal shift and pain and symptoms increased to 10/10.     Diagnostic tests  see MRI results: C3/4 disc    Patient Stated Goals  Get back to normal so I can go back to work.    Currently in Pain?  Yes    Pain Score  10-Worst pain ever    Pain Location  Neck    Pain Orientation  Left    Pain Descriptors / Indicators  Throbbing    Pain Type  Acute pain    Pain Radiating Towards  to L elbow    Pain Onset  More than a month ago    Pain Frequency  Constant    Pain Relieving Factors  traction, cerv retraction                       [facility] Adult PT Treatment/Exercise - 08/17/17 0001      Self-Care   Self-Care  Other Self-Care Comments    Other Self-Care Comments   Discussed importance of symptom control and holding exercises until sx calmed down. Discussed effects fo her work on increased symptoms and need to communicate with MD.      Modalities   Modalities  Traction;Electrical Stimulation;Moist Surveyor, mining Location  L  cervical and upper quadrant    Electrical Stimulation Action  IFC 80-150 Hz x 20 min to tolerance    Electrical Stimulation Goals  Pain      Traction   Type of Traction  Cervical    Min (lbs)  3    Max (lbs)  12    Hold Time  99    Rest Time  5    Time  15                  PT Long Term Goals - 08/07/17 1240      PT LONG TERM GOAL #1   Title  Independent with a HEP.    Status  On-going      PT LONG TERM GOAL #2   Title  Increase active cervical rotation to 80 degrees+ so patient can turn head more easily while driving.    Time  8    Period  Weeks    Status  On-going      PT LONG TERM GOAL #3   Title  Eliminate left  UE pain and symptoms.    Period  Weeks    Status  On-going      PT LONG TERM GOAL #4   Title  Perform ADL's with pain not > 3/10.    Time  8    Period  Weeks    Status  On-going            Plan - 08/17/17 1141    Clinical Impression Statement  Patient presents today with reports of 10/10 pain since returning to work. Patient was crying due to pain. Plan today was to minimize pain as much as possible. No significant relief from estim. Traction centralized arm pain to medial scapula and neck with pain reported at 8/10 at end of treatment. PT advised calling MD to report effects of RTW.    PT Treatment/Interventions  ADLs/Self Care Home Management;Electrical Stimulation;Cryotherapy;Moist Heat;Ultrasound;Therapeutic exercise;Therapeutic activities;Patient/family education;Passive range of motion;Manual techniques;Dry needling;Vasopneumatic Device;Traction;Neuromuscular re-education    PT Next Visit Plan  Symptom control and progress to exercises per tolerance; full spinal stabilization; body mechanics for job and quads strengthening for this.    PT Home Exercise Plan  cervical retraction, rows, extension red tband; wall squats       Patient will benefit from skilled therapeutic intervention in order to improve the following deficits and  impairments:  Decreased activity tolerance, Decreased range of motion, Decreased strength, Increased muscle spasms, Postural dysfunction, Pain  Visit Diagnosis: Cervicalgia     Problem List Patient Active Problem List   Diagnosis Date Noted  . Radiculopathy, cervical region 08/07/2017  . Neck pain 06/19/2017    Solon Palm PT 08/17/2017, 12:42 PM  [facility] [address] [place], [place], [address] Phone: [phone]   Fax:  [phone]  Name: Stacy Rogers MRN: [id number] Date of Birth: 31-Mar-1986

## 2017-08-23 ENCOUNTER — Ambulatory Visit: Payer: BLUE CROSS/BLUE SHIELD | Admitting: Physical Therapy

## 2017-08-23 DIAGNOSIS — M542 Cervicalgia: Secondary | ICD-10-CM | POA: Diagnosis not present

## 2017-08-23 DIAGNOSIS — R293 Abnormal posture: Secondary | ICD-10-CM

## 2017-08-23 NOTE — Therapy (Signed)
[facility] [address] [place], [place], [address] Phone: [phone]   Fax:  [phone]  Physical Therapy Treatment  Patient Details  Name: Stacy Rogers MRN: [id number] Date of Birth: 09/02/1985 Referring Provider: Gershon Mussel MD.   Encounter Date: 08/23/2017  PT End of Session - 08/23/17 1440    Visit Number  14    Number of Visits  19    Date for PT Re-Evaluation  09/07/17    PT Start Time  0102    PT Stop Time  0157    PT Time Calculation (min)  55 min    Activity Tolerance  Patient limited by pain    Behavior During Therapy  [facility] for tasks assessed/performed       No past medical history on file.  No past surgical history on file.  There were no vitals filed for this visit.  Subjective Assessment - 08/23/17 1330    Subjective  I'm getting an injection next week.  The traction helped.    Diagnostic tests  see MRI results: C3/4 disc    Patient Stated Goals  Get back to normal so I can go back to work.    Currently in Pain?  Yes    Pain Score  6     Pain Location  Neck    Pain Orientation  Left    Pain Descriptors / Indicators  Throbbing    Pain Type  Acute pain    Pain Onset  More than a month ago                       [facility] Adult PT Treatment/Exercise - 08/23/17 0001      Modalities   Modalities  Electrical Stimulation;Moist Heat;Traction      Moist Heat Therapy   Number Minutes Moist Heat  15 Minutes    Moist Heat Location  -- Left cervical spine.      Programme researcher, broadcasting/film/video Location  Left cervical.    Electrical Stimulation Action  Pre-mod.    Electrical Stimulation Parameters  80-150 Hz x 15 minutes.    Electrical Stimulation Goals  Pain      Traction   Type of Traction  Cervical    Min (lbs)  3    Max (lbs)  14    Hold Time  99    Rest Time  5      Manual Therapy   Manual Therapy  Soft tissue mobilization    Soft tissue mobilization  STW/M x 8 minutes  to patient affected left cervical musculature.                  PT Long Term Goals - 08/07/17 1240      PT LONG TERM GOAL #1   Title  Independent with a HEP.    Status  On-going      PT LONG TERM GOAL #2   Title  Increase active cervical rotation to 80 degrees+ so patient can turn head more easily while driving.    Time  8    Period  Weeks    Status  On-going      PT LONG TERM GOAL #3   Title  Eliminate left UE pain and symptoms.    Period  Weeks    Status  On-going      PT LONG TERM GOAL #4   Title  Perform ADL's with pain not > 3/10.  Time  8    Period  Weeks    Status  On-going            Plan - 08/23/17 1451    Clinical Impression Statement  Patient felt better after traction today with a 2# increase.    PT Treatment/Interventions  ADLs/Self Care Home Management;Electrical Stimulation;Cryotherapy;Moist Heat;Ultrasound;Therapeutic exercise;Therapeutic activities;Patient/family education;Passive range of motion;Manual techniques;Dry needling;Vasopneumatic Device;Traction;Neuromuscular re-education    PT Next Visit Plan  Symptom control and progress to exercises per tolerance; full spinal stabilization; body mechanics for job and quads strengthening for this.    PT Home Exercise Plan  cervical retraction, rows, extension red tband; wall squats    Consulted and Agree with Plan of Care  Patient       Patient will benefit from skilled therapeutic intervention in order to improve the following deficits and impairments:  Decreased activity tolerance, Decreased range of motion, Decreased strength, Increased muscle spasms, Postural dysfunction, Pain  Visit Diagnosis: Cervicalgia  Abnormal posture     Problem List Patient Active Problem List   Diagnosis Date Noted  . Radiculopathy, cervical region 08/07/2017  . Neck pain 06/19/2017    Stacy Rogers, [place] MPT 08/23/2017, 3:01 PM  [facility] [address] [place], [place], [address] Phone: [phone]   Fax:  [phone]  Name: Stacy Rogers MRN: [id number] Date of Birth: 19-Dec-1985

## 2017-08-27 ENCOUNTER — Ambulatory Visit: Payer: BLUE CROSS/BLUE SHIELD | Admitting: Physical Therapy

## 2017-08-27 DIAGNOSIS — M542 Cervicalgia: Secondary | ICD-10-CM | POA: Diagnosis not present

## 2017-08-27 DIAGNOSIS — R293 Abnormal posture: Secondary | ICD-10-CM

## 2017-08-27 NOTE — Addendum Note (Signed)
Addended by: Guss Bunde on: 08/27/2017 04:34 PM   Modules accepted: Orders

## 2017-08-27 NOTE — Therapy (Addendum)
[facility] [address] [place], [place], [address] Phone: [phone]   Fax:  [phone]  Physical Therapy Treatment  Patient Details  Name: Stacy Rogers MRN: [id number] Date of Birth: 1986/03/28 Referring Provider: Gershon Mussel MD.   Encounter Date: 08/27/2017  PT End of Session - 08/27/17 1347    Visit Number  15    Number of Visits  19    Date for PT Re-Evaluation  10/05/17    PT Start Time  1345 PT required to take doctor's phone call during session    PT Stop Time  1433    PT Time Calculation (min)  48 min    Activity Tolerance  Patient limited by pain    Behavior During Therapy  [facility] for tasks assessed/performed       No past medical history on file.  No past surgical history on file.  There were no vitals filed for this visit.  Subjective Assessment - 08/27/17 1347    Subjective  Patient reports 5-6/10 pain with tyenol and ibuprofen today. Patient to recieve cervical injection tomorrow.    Diagnostic tests  see MRI results: C3/4 disc    Patient Stated Goals  Get back to normal so I can go back to work.    Currently in Pain?  Yes    Pain Score  6     Pain Orientation  Left    Pain Descriptors / Indicators  Tightness    Pain Type  Acute pain    Pain Onset  More than a month ago         [facility] PT Assessment - 08/27/17 0001      Assessment   Medical Diagnosis  Cervical strain                   [facility] Adult PT Treatment/Exercise - 08/27/17 0001      Traction   Type of Traction  Cervical    Min (lbs)  3    Max (lbs)  14    Hold Time  99    Rest Time  5    Time  15      Manual Therapy   Manual Therapy  Soft tissue mobilization    Soft tissue mobilization  STW/M to left cervical and left UT with trigger point release to decease muscle tension.                  PT Long Term Goals - 08/07/17 1240      PT LONG TERM GOAL #1   Title  Independent with a HEP.    Status  On-going      PT LONG TERM GOAL #2   Title  Increase active cervical rotation to 80 degrees+ so patient can turn head more easily while driving.    Time  8    Period  Weeks    Status  On-going      PT LONG TERM GOAL #3   Title  Eliminate left UE pain and symptoms.    Period  Weeks    Status  On-going      PT LONG TERM GOAL #4   Title  Perform ADL's with pain not > 3/10.    Time  8    Period  Weeks    Status  On-going            Plan - 08/27/17 1620    Clinical Impression Statement  Patient was able to complete treatment fairly.  Patient noted with increased muscle tension in L UT; patient reported tenderness upon deep STW/M. Trigger point release performed to which trigger point decreased but still palpable with deep palpation. Patient noted with increased pull with old traction machine despite keeping weight the same. Patient would prefer to use new traction machine going forward.     Clinical Presentation  Stable    Clinical Decision Making  Low    Rehab Potential  Good    PT Frequency  2x / week    PT Duration  4 weeks    PT Treatment/Interventions  ADLs/Self Care Home Management;Electrical Stimulation;Cryotherapy;Moist Heat;Ultrasound;Therapeutic exercise;Therapeutic activities;Patient/family education;Passive range of motion;Manual techniques;Dry needling;Vasopneumatic Device;Traction;Neuromuscular re-education    PT Next Visit Plan  Symptom control and progress to exercises per tolerance; full spinal stabilization; body mechanics for job and quads strengthening for this.    Consulted and Agree with Plan of Care  Patient       Patient will benefit from skilled therapeutic intervention in order to improve the following deficits and impairments:  Decreased activity tolerance, Decreased range of motion, Decreased strength, Increased muscle spasms, Postural dysfunction, Pain  Visit Diagnosis: Cervicalgia  Abnormal posture     Problem List Patient Active Problem List   Diagnosis  Date Noted  . Radiculopathy, cervical region 08/07/2017  . Neck pain 06/19/2017   Guss Bunde, PT, DPT 08/27/2017, 4:31 PM  [facility] [address] [place], [place], [address] Phone: [phone]   Fax:  [phone]  Name: Stacy Rogers MRN: [id number] Date of Birth: 06/04/1985

## 2017-08-28 ENCOUNTER — Ambulatory Visit (INDEPENDENT_AMBULATORY_CARE_PROVIDER_SITE_OTHER): Payer: BLUE CROSS/BLUE SHIELD | Admitting: Physical Medicine and Rehabilitation

## 2017-08-28 ENCOUNTER — Encounter

## 2017-08-28 ENCOUNTER — Encounter (INDEPENDENT_AMBULATORY_CARE_PROVIDER_SITE_OTHER): Payer: Self-pay | Admitting: Physical Medicine and Rehabilitation

## 2017-08-28 VITALS — BP 104/77 | HR 81 | Temp 98.7°F

## 2017-08-28 DIAGNOSIS — M5412 Radiculopathy, cervical region: Secondary | ICD-10-CM

## 2017-08-28 DIAGNOSIS — M5010 Cervical disc disorder with radiculopathy, unspecified cervical region: Secondary | ICD-10-CM

## 2017-08-28 NOTE — Progress Notes (Signed)
.  Numeric Pain Rating Scale and Functional Assessment Average Pain 9 Pain Right Now 6 My pain is constant, burning, tingling and aching Pain is worse with: some activites Pain improves with: rest   In the last MONTH (on 0-10 scale) has pain interfered with the following?  1. General activity like being  able to carry out your everyday physical activities such as walking, climbing stairs, carrying groceries, or moving a chair?  Rating(6)  2. Relation with others like being able to carry out your usual social activities and roles such as  activities at home, at work and in your community. Rating(7)  3. Enjoyment of life such that you have  been bothered by emotional problems such as feeling anxious, depressed or irritable?  Rating(5)

## 2017-08-29 ENCOUNTER — Encounter (INDEPENDENT_AMBULATORY_CARE_PROVIDER_SITE_OTHER): Payer: Self-pay | Admitting: Physical Medicine and Rehabilitation

## 2017-08-29 MED ORDER — DIAZEPAM 5 MG PO TABS: ORAL_TABLET | ORAL | 0 refills | Status: DC
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
## 2017-09-04 ENCOUNTER — Ambulatory Visit (INDEPENDENT_AMBULATORY_CARE_PROVIDER_SITE_OTHER): Payer: BLUE CROSS/BLUE SHIELD | Admitting: Physical Medicine and Rehabilitation

## 2017-09-04 ENCOUNTER — Ambulatory Visit (INDEPENDENT_AMBULATORY_CARE_PROVIDER_SITE_OTHER): Payer: Self-pay

## 2017-09-04 ENCOUNTER — Encounter (INDEPENDENT_AMBULATORY_CARE_PROVIDER_SITE_OTHER): Payer: Self-pay | Admitting: Physical Medicine and Rehabilitation

## 2017-09-04 VITALS — BP 114/66 | HR 80 | Temp 98.3°F

## 2017-09-04 DIAGNOSIS — M5010 Cervical disc disorder with radiculopathy, unspecified cervical region: Secondary | ICD-10-CM

## 2017-09-04 DIAGNOSIS — M5412 Radiculopathy, cervical region: Secondary | ICD-10-CM | POA: Diagnosis not present

## 2017-09-04 MED ORDER — METHYLPREDNISOLONE ACETATE 80 MG/ML IJ SUSP
80.0000 mg | Freq: Once | INTRAMUSCULAR | Status: AC
  Administered 2017-09-04: 80 mg

## 2017-09-04 NOTE — Patient Instructions (Signed)

## 2017-09-04 NOTE — Progress Notes (Signed)
 .  Numeric Pain Rating Scale and Functional Assessment Average Pain 8   In the last MONTH (on 0-10 scale) has pain interfered with the following?  1. General activity like being  able to carry out your everyday physical activities such as walking, climbing stairs, carrying groceries, or moving a chair?  Rating(5)   +Driver, -BT, -Dye Allergies.  

## 2017-09-05 NOTE — Procedures (Signed)
Cervical Epidural Steroid Injection - Interlaminar Approach with Fluoroscopic Guidance  Patient: Stacy Rogers      Date of Birth: 08/19/1985 MRN: [id number] PCP: Benita Stabile, MD      Visit Date: 09/04/2017   Universal Protocol:    Date/Time: 05/28/198:35 AM  Consent Given By: the patient  Position: PRONE  Additional Comments: Vital signs were monitored before and after the procedure. Patient was prepped and draped in the usual sterile fashion. The correct patient, procedure, and site was verified.   Injection Procedure Details:  Procedure Site One Meds Administered:  Meds ordered this encounter  Medications  . methylPREDNISolone acetate (DEPO-MEDROL) injection 80 mg     Laterality: Left  Location/Site: C7-T1  Needle size: 20 G  Needle type: Touhy  Needle Placement: Paramedian epidural space  Findings:  -Comments: Excellent flow of contrast into the epidural space.  Procedure Details: Using a paramedian approach from the side mentioned above, the region overlying the inferior lamina was localized under fluoroscopic visualization and the soft tissues overlying this structure were infiltrated with 4 ml. of 1% Lidocaine without Epinephrine. A # 20 gauge, Tuohy needle was inserted into the epidural space using a paramedian approach.  The epidural space was localized using loss of resistance along with lateral and contralateral oblique bi-planar fluoroscopic views.  After negative aspirate for air, blood, and CSF, a 2 ml. volume of Isovue-250 was injected into the epidural space and the flow of contrast was observed. Radiographs were obtained for documentation purposes.   The injectate was administered into the level noted above.  Additional Comments:  The patient tolerated the procedure well No complications occurred.  Patient did feel some discomfort across the chest which can happen with injection at C7-T1 with some irritation of the T1 nerve roots.  This could also  be just laying on the oak works Therapist, music. Dressing: Band-Aid    Post-procedure details: Patient was observed during the procedure. Post-procedure instructions were reviewed.  Patient left the clinic in stable condition.

## 2017-09-05 NOTE — Progress Notes (Signed)
Stacy Rogers - 32 y.o. female MRN [id number]  Date of birth: 05-14-1985  Office Visit Note: Visit Date: 09/04/2017 PCP: Benita Stabile, MD Referred by: Benita Stabile, MD  Subjective: Chief Complaint  Patient presents with  . Neck - Follow-up   HPI: [name]  is a 32 year old right-hand-dominant female with chronic worsening left shoulder pain who comes in today for planned left C7-T1 interlaminar epidural steroid injection.  Patient did have Valium preprocedure.  She will return to see Dr. Roda Shutters and  Tessa Lerner, PA-C depending on results.   ROS Otherwise per HPI.  Assessment & Plan: Visit Diagnoses:  1. Cervical radiculopathy   2. Cervical disc disorder with radiculopathy     Plan: No additional findings.   Meds & Orders:  Meds ordered this encounter  Medications  . methylPREDNISolone acetate (DEPO-MEDROL) injection 80 mg    Orders Placed This Encounter  Procedures  . XR C-ARM NO REPORT  . Epidural Steroid injection    Follow-up: Return in about 2 weeks (around 09/18/2017), or if symptoms worsen or fail to improve, for Dr. Roda Shutters.   Procedures: No procedures performed  Cervical Epidural Steroid Injection - Interlaminar Approach with Fluoroscopic Guidance  Patient: Stacy Rogers      Date of Birth: [birth year] MRN: [id number] PCP: Benita Stabile, MD      Visit Date: 09/04/2017   Universal Protocol:    Date/Time: 05/28/198:35 AM  Consent Given By: the patient  Position: PRONE  Additional Comments: Vital signs were monitored before and after the procedure. Patient was prepped and draped in the usual sterile fashion. The correct patient, procedure, and site was verified.   Injection Procedure Details:  Procedure Site One Meds Administered:  Meds ordered this encounter  Medications  . methylPREDNISolone acetate (DEPO-MEDROL) injection 80 mg     Laterality: Left  Location/Site: C7-T1  Needle size: 20 G  Needle type: Touhy  Needle Placement: Paramedian  epidural space  Findings:  -Comments: Excellent flow of contrast into the epidural space.  Procedure Details: Using a paramedian approach from the side mentioned above, the region overlying the inferior lamina was localized under fluoroscopic visualization and the soft tissues overlying this structure were infiltrated with 4 ml. of 1% Lidocaine without Epinephrine. A # 20 gauge, Tuohy needle was inserted into the epidural space using a paramedian approach.  The epidural space was localized using loss of resistance along with lateral and contralateral oblique bi-planar fluoroscopic views.  After negative aspirate for air, blood, and CSF, a 2 ml. volume of Isovue-250 was injected into the epidural space and the flow of contrast was observed. Radiographs were obtained for documentation purposes.   The injectate was administered into the level noted above.  Additional Comments:  The patient tolerated the procedure well No complications occurred.  Patient did feel some discomfort across the chest which can happen with injection at C7-T1 with some irritation of the T1 nerve roots.  This could also be just laying on the oak works Therapist, music. Dressing: Band-Aid    Post-procedure details: Patient was observed during the procedure. Post-procedure instructions were reviewed.  Patient left the clinic in stable condition.    Clinical History: MRI CERVICAL SPINE WITHOUT CONTRAST  TECHNIQUE: Multiplanar, multisequence MR imaging of the cervical spine was performed. No intravenous contrast was administered.  COMPARISON:  None.  FINDINGS: Alignment: Normal  Vertebrae: Normal  Cord: Normal  Posterior Fossa, vertebral arteries, paraspinal tissues: Normal  Disc levels:  No abnormality at the foramen magnum,  C1-2 or C2-3.  At C3-4, there is a tiny left posterolateral to foraminal disc protrusion that could cause focal irritation of the left C4 nerve. Nerve compression is not  demonstrated however.  No abnormality at C4-5.  C5-6 shows minimal desiccation and bulging of the disc but no stenosis.  C6-7 shows minimal disc bulging but no stenosis.  C7-T1 and T1-2 are normal.  IMPRESSION: Very small left posterolateral disc protrusion at C3-4 with proximal foraminal encroachment that could possibly be associated with irritation of the left C4 nerve.   Electronically Signed   By: Paulina Fusi M.D.   On: 08/02/2017 20:07   She reports that she has been smoking.  She has never used smokeless tobacco. No results for input(s): HGBA1C, LABURIC in the last 8760 hours.  Objective:  VS:  HT:    WT:   BMI:     BP:114/66  HR:80bpm  TEMP:98.3 F (36.8 C)( )  RESP:96 % Physical Exam  Ortho Exam Imaging: Xr C-arm No Report  Result Date: 09/04/2017 Please see Notes or Procedures tab for imaging impression.   Past Medical/Family/Surgical/Social History: Medications & Allergies reviewed per EMR, new medications updated. Patient Active Problem List   Diagnosis Date Noted  . Radiculopathy, cervical region 08/07/2017  . Neck pain 06/19/2017   No past medical history on file. No family history on file. No past surgical history on file. Social History   Occupational History  . Not on file  Tobacco Use  . Smoking status: Current Every Day Smoker  . Smokeless tobacco: Never Used  Substance and Sexual Activity  . Alcohol use: Not on file  . Drug use: Not on file  . Sexual activity: Not on file

## 2017-09-25 ENCOUNTER — Encounter: Payer: BLUE CROSS/BLUE SHIELD | Admitting: Physical Therapy

## 2017-09-28 ENCOUNTER — Ambulatory Visit: Payer: BLUE CROSS/BLUE SHIELD | Attending: Orthopaedic Surgery | Admitting: Physical Therapy

## 2017-09-28 DIAGNOSIS — M542 Cervicalgia: Secondary | ICD-10-CM | POA: Diagnosis not present

## 2017-09-28 NOTE — Therapy (Addendum)
[facility] [place], [place], [address] Phone: [phone]   Fax:  [phone]  Physical Therapy Treatment  Patient Details  Name: Stacy Rogers MRN: [id number] Date of Birth: Jan 27, 1986 Referring Provider: Frankey Shown MD.   Encounter Date: 09/28/2017  PT End of Session - 09/28/17 0951    Visit Number  16    Number of Visits  19    Date for PT Re-Evaluation  10/05/17    PT Start Time  0950    PT Stop Time  7939    PT Time Calculation (min)  57 min    Activity Tolerance  Patient limited by pain;Patient tolerated treatment well    Behavior During Therapy  [facility] for tasks assessed/performed       No past medical history on file.  No past surgical history on file.  There were no vitals filed for this visit.  Subjective Assessment - 09/28/17 0951    Subjective  Patient had an injection 09/04/17 and she had complete relief until 09/23/17. She was pushing a cart at work and it was stuck. The extra push caused her neck to lock up. It started to release and then immediately felt pain down her arm with tingling. The tingling has resolved but she presently has aching from left lateral neck into upper arm.    Patient Stated Goals  Get back to normal so I can go back to work.    Currently in Pain?  Yes    Pain Score  7     Pain Location  Neck    Pain Orientation  Left    Pain Descriptors / Indicators  Tightness    Pain Type  Acute pain    Pain Radiating Towards  to left elbow    Pain Onset  More than a month ago    Pain Frequency  Constant         [facility] PT Assessment - 09/28/17 0001      AROM   AROM Assessment Site  Cervical    Cervical - Right Rotation  90%    Cervical - Left Rotation  75%      Distraction Test   Findngs  Positive    side  Left                   [facility] Adult PT Treatment/Exercise - 09/28/17 0001      Self-Care   Self-Care  Other Self-Care Comments    Other Self-Care Comments    discussed home traction units and looked them up on computer      Neck Exercises: Seated   Neck Retraction  20 reps 10 with extension; no change       Modalities   Modalities  Electrical Stimulation;Moist Heat;Traction      Moist Heat Therapy   Number Minutes Moist Heat  15 Minutes    Moist Heat Location  Cervical;Shoulder      Electrical Stimulation   Electrical Stimulation Location  L cervical and upper arm    Electrical Stimulation Action  premod    Electrical Stimulation Parameters  80-150 Hz x 15 min    Electrical Stimulation Goals  Pain      Traction   Type of Traction  Cervical    Min (lbs)  5    Max (lbs)  14    Hold Time  99    Rest Time  5    Time  15  PT Long Term Goals - 09/28/17 1056      PT LONG TERM GOAL #1   Title  Independent with a HEP.    Status  Achieved      PT LONG TERM GOAL #2   Title  Increase active cervical rotation to 80 degrees+ so patient can turn head more easily while driving.    Time  8    Period  Weeks    Status  On-going      PT LONG TERM GOAL #3   Title  Eliminate left UE pain and symptoms.    Time  8    Period  Weeks    Status  On-going      PT LONG TERM GOAL #4   Title  Perform ADL's with pain not > 3/10.    Time  8    Period  Weeks    Status  On-going            Plan - 09/28/17 1053    Clinical Impression Statement  Patient presents after one month absence. She had injection with full relief and then reinjured at work pushing a cart. She now has sx in left neck to L elbow. She denies tingling after first few days, but has constant pain. She had relief of sx with manual traction so mechanical traction used. PT advised home cervical traction unit to pt.    Rehab Potential  Good    PT Frequency  2x / week    PT Duration  4 weeks    PT Treatment/Interventions  ADLs/Self Care Home Management;Electrical Stimulation;Cryotherapy;Moist Heat;Ultrasound;Therapeutic exercise;Therapeutic  activities;Patient/family education;Passive range of motion;Manual techniques;Dry needling;Vasopneumatic Device;Traction;Neuromuscular re-education    PT Next Visit Plan  Continue with traction for Symptom control and progress to exercises per tolerance; full spinal stabilization; body mechanics for job and quads strengthening for this.    Consulted and Agree with Plan of Care  Patient       Patient will benefit from skilled therapeutic intervention in order to improve the following deficits and impairments:  Decreased activity tolerance, Decreased range of motion, Decreased strength, Increased muscle spasms, Postural dysfunction, Pain  Visit Diagnosis: Cervicalgia     Problem List Patient Active Problem List   Diagnosis Date Noted  . Radiculopathy, cervical region 08/07/2017  . Neck pain 06/19/2017    Leng Montesdeoca PT 09/28/2017, 10:59 AM  [facility] [address] [place], [place], [address] Phone: [phone]   Fax:  [phone]  Name: Stacy Rogers MRN: [id number] Date of Birth: [birth year]  PHYSICAL THERAPY DISCHARGE SUMMARY  Visits from Start of Care: 16.  Current functional level related to goals / functional outcomes: See above.   Remaining deficits: See below.   Education / Equipment: HEP. Plan: Patient agrees to discharge.  Patient goals were partially met. Patient is being discharged due to not returning since the last visit.  ?????           Mali Applegate MPT

## 2017-12-13 ENCOUNTER — Ambulatory Visit (INDEPENDENT_AMBULATORY_CARE_PROVIDER_SITE_OTHER): Payer: BLUE CROSS/BLUE SHIELD | Admitting: Orthopaedic Surgery

## 2017-12-21 ENCOUNTER — Ambulatory Visit (INDEPENDENT_AMBULATORY_CARE_PROVIDER_SITE_OTHER): Payer: BLUE CROSS/BLUE SHIELD | Admitting: Orthopaedic Surgery

## 2017-12-21 ENCOUNTER — Encounter (INDEPENDENT_AMBULATORY_CARE_PROVIDER_SITE_OTHER): Payer: Self-pay | Admitting: Orthopaedic Surgery

## 2017-12-21 DIAGNOSIS — M542 Cervicalgia: Secondary | ICD-10-CM | POA: Diagnosis not present

## 2017-12-21 MED ORDER — TRAMADOL HCL 50 MG PO TABS: ORAL_TABLET | ORAL | 0 refills | Status: DC

## 2017-12-21 NOTE — Addendum Note (Signed)
Addended by: Albertina ParrGARCIA, Meshawn Oconnor on: 12/21/2017 11:00 AM   Modules accepted: Orders

## 2017-12-21 NOTE — Progress Notes (Signed)
      Patient: Stacy Rogers           Date of Birth: 01/26/1986           MRN: [id number] Visit Date: 12/21/2017 PCP: Benita StabileHall, John Z, MD   Assessment & Plan:  Chief Complaint:  Chief Complaint  Patient presents with  . Neck - Pain  . Left Shoulder - Pain   Visit Diagnoses:  1. Cervicalgia     Plan: Patient is a pleasant 32 year old female who presents to our clinic today with recurrent left-sided neck and left arm pain.  She has a history of small disc protrusion at C3-4.  She was seen by Dr. Alvester MorinNewton in May of this past year where she was given an ESI at C7-T1.  She did note moderate improvement of symptoms but these were temporary.  Her pain has returned.  The pain is primarily to the lateral side of her left neck radiating into the parascapular region and occasionally down into her upper arm.  There is nothing that seems to make this worse as it is fairly constant in nature.  She still notes burning pain that does radiate down her arm and into her hand.  She is also starting to get headaches.  Examination of her cervical spine reveals mild paraspinous tenderness on the left with tender and palpable trigger points to the parascapular region.  She does have limited range of motion of the neck secondary to stiffness.  Full range of motion of the shoulder without pain.  At this point, we will refer her back to Dr. Alvester MorinNewton for a repeat epidural steroid injection versus trigger point injection.  She will follow-up with us as needed.  Follow-Up Instructions: Return if symptoms worsen or fail to improve.   Orders:  No orders of the defined types were placed in this encounter.  No orders of the defined types were placed in this encounter.   Imaging: No results found.  PMFS History: Patient Active Problem List   Diagnosis Date Noted  . Radiculopathy, cervical region 08/07/2017  . Cervicalgia 06/19/2017   History reviewed. No pertinent past medical history.  History reviewed. No  pertinent family history.  History reviewed. No pertinent surgical history. Social History   Occupational History  . Not on file  Tobacco Use  . Smoking status: Current Every Day Smoker  . Smokeless tobacco: Never Used  Substance and Sexual Activity  . Alcohol use: Not on file  . Drug use: Not on file  . Sexual activity: Not on file

## 2018-01-04 ENCOUNTER — Telehealth (INDEPENDENT_AMBULATORY_CARE_PROVIDER_SITE_OTHER): Payer: Self-pay | Admitting: *Deleted

## 2018-01-04 ENCOUNTER — Ambulatory Visit (INDEPENDENT_AMBULATORY_CARE_PROVIDER_SITE_OTHER): Payer: BLUE CROSS/BLUE SHIELD | Admitting: Physical Medicine and Rehabilitation

## 2018-01-04 ENCOUNTER — Encounter (INDEPENDENT_AMBULATORY_CARE_PROVIDER_SITE_OTHER): Payer: Self-pay | Admitting: Physical Medicine and Rehabilitation

## 2018-01-04 VITALS — BP 106/68 | HR 69 | Ht 64.0 in | Wt 95.6 lb

## 2018-01-04 DIAGNOSIS — M5010 Cervical disc disorder with radiculopathy, unspecified cervical region: Secondary | ICD-10-CM

## 2018-01-04 DIAGNOSIS — M5412 Radiculopathy, cervical region: Secondary | ICD-10-CM | POA: Diagnosis not present

## 2018-01-04 MED ORDER — DIAZEPAM 5 MG PO TABS: ORAL_TABLET | ORAL | 0 refills | Status: DC

## 2018-01-04 NOTE — Progress Notes (Signed)
 .  Numeric Pain Rating Scale and Functional Assessment Average Pain 9 Pain Right Now 7 My pain is intermittent and aching Pain is worse with: some activites Pain improves with: rest   In the last MONTH (on 0-10 scale) has pain interfered with the following?  1. General activity like being  able to carry out your everyday physical activities such as walking, climbing stairs, carrying groceries, or moving a chair?  Rating(6)  2. Relation with others like being able to carry out your usual social activities and roles such as  activities at home, at work and in your community. Rating(4)  3. Enjoyment of life such that you have  been bothered by emotional problems such as feeling anxious, depressed or irritable?  Rating(3)

## 2018-01-04 NOTE — Telephone Encounter (Signed)
Spoke to representative from [place] and she states prior Berkley Harvey is not required for code 16109. Reference [phone]

## 2018-01-04 NOTE — Progress Notes (Signed)
Stacy Rogers - 32 y.o. female MRN [id number]  Date of birth: 1986-01-12  Office Visit Note: Visit Date: 01/04/2018 PCP: Stacy Stabile, MD Referred by: Stacy Stabile, MD  Subjective: Chief Complaint  Patient presents with  . Neck - Pain  . Left Upper Arm - Pain   HPI: Stacy Rogers is a 32 year old female, right-hand-dominant, who I last saw in May of this year and completed C7-T1 interlaminar epidural steroid injection on the left.  She reports 100% relief for about 1 month.  Symptoms gradually recurred with activity and continued movement.  She is been actively undergoing physical therapy they have done dry needling.  She follow-up with Dr. Roda Shutters suggested repeat injection versus trigger point injection.  She is having referral in the left neck and she is upper shoulder laterally.  Not so much in the way of tingling numbness in the hands.  Tingling and numbness seem to have been better after the last injection.  Her average pain is a 9 out of 10 intermittent achy worse with activity better with some medication and rest.  She has had no new trauma.  She said no focal weakness.  No other new symptoms.  She is starting to get may be some headache with this.   Review of Systems  Constitutional: Negative for chills, fever, malaise/fatigue and weight loss.  HENT: Negative for hearing loss and sinus pain.   Eyes: Negative for blurred vision, double vision and photophobia.  Respiratory: Negative for cough and shortness of breath.   Cardiovascular: Negative for chest pain, palpitations and leg swelling.  Gastrointestinal: Negative for abdominal pain, nausea and vomiting.  Genitourinary: Negative for flank pain.  Musculoskeletal: Positive for joint pain and neck pain. Negative for myalgias.  Skin: Negative for itching and rash.  Neurological: Positive for tingling. Negative for tremors, focal weakness and weakness.  Endo/Heme/Allergies: Negative.   Psychiatric/Behavioral: Negative for depression.    All other systems reviewed and are negative.  Otherwise per HPI.  Assessment & Plan: Visit Diagnoses: No diagnosis found.  Plan: Findings:  Chronic left neck and shoulder pain which we feel like is really related probably to see 3-4 disc protrusion which is somewhat foraminal and paracentral.  Cervical epidural was diagnostic and that it helped 100% for a month.  She was doing quite well.  She has done conservative care quite well with physical therapy and medication management without much relief.  Physical therapist has been doing dry needling and she has felt like this has not helped very much.  Exam does show focal trigger points in the levator scapula rhomboids and supraspinatus.  I think if the dry needling is not very helpful in the next step really is to repeat the cervical epidural injection.  Depending on relief with the epidural injection I would look at the C4 transforaminal injection we talked about the risks and benefits of that.  We will see her back for the epidural injection she will continue with conservative care.  Still would consider trigger point injection if need be.  I will provide her with oral sedation just like we did before.    Meds & Orders:  Meds ordered this encounter  Medications  . diazepam (VALIUM) 5 MG tablet    Sig: Take 1 by mouth 1 to 2 hours pre-procedure. May repeat if necessary.    Dispense:  2 tablet    Refill:  0   No orders of the defined types were placed in this encounter.  Follow-up: Return for Left C7-T1 interlaminar epidural steroid injection..   Procedures: No procedures performed  No notes on file   Clinical History: MRI CERVICAL SPINE WITHOUT CONTRAST  TECHNIQUE: Multiplanar, multisequence MR imaging of the cervical spine was performed. No intravenous contrast was administered.  COMPARISON:  None.  FINDINGS: Alignment: Normal  Vertebrae: Normal  Cord: Normal  Posterior Fossa, vertebral arteries, paraspinal tissues:  Normal  Disc levels:  No abnormality at the foramen magnum, C1-2 or C2-3.  At C3-4, there is a tiny left posterolateral to foraminal disc protrusion that could cause focal irritation of the left C4 nerve. Nerve compression is not demonstrated however.  No abnormality at C4-5.  C5-6 shows minimal desiccation and bulging of the disc but no stenosis.  C6-7 shows minimal disc bulging but no stenosis.  C7-T1 and T1-2 are normal.  IMPRESSION: Very small left posterolateral disc protrusion at C3-4 with proximal foraminal encroachment that could possibly be associated with irritation of the left C4 nerve.   Electronically Signed   By: Paulina Fusi M.D.   On: 08/02/2017 20:07   She reports that she has been smoking. She has never used smokeless tobacco. No results for input(s): HGBA1C, LABURIC in the last 8760 hours.  Objective:  VS:  HT:5\' 4"  (162.6 cm)   WT:95 lb 9.6 oz (43.4 kg)  BMI:16.4    BP:106/68  HR:69bpm  TEMP: ( )  RESP:95 % Physical Exam  Constitutional: She is oriented to person, place, and time. She appears well-developed and well-nourished.  Eyes: Pupils are equal, round, and reactive to light. Conjunctivae and EOM are normal.  Neck: Normal range of motion. No JVD present. No tracheal deviation present.  Cardiovascular: Normal rate and intact distal pulses.  Pulmonary/Chest: Effort normal.  Musculoskeletal:  Cervical range of motion is full with forward flexion although that does cause some pain.  She has a equivocal Spurling's to the left not the right.  She has some pain with extension rotation on the left with some limited range of motion with rotation of the left.  She has active trigger points in the levator scapula rhomboid and supraspinatus.  She has no shoulder impingement sign.  She has good upper extremity strength bilaterally symmetric.  Neurological: She is alert and oriented to person, place, and time. She exhibits normal muscle tone.  Coordination normal.  Skin: Skin is warm and dry. No rash noted. No erythema.  Psychiatric: She has a normal mood and affect. Her behavior is normal.  Nursing note and vitals reviewed.   Ortho Exam Imaging: No results found.  Past Medical/Family/Surgical/Social History: Medications & Allergies reviewed per EMR, new medications updated. Patient Active Problem List   Diagnosis Date Noted  . Radiculopathy, cervical region 08/07/2017  . Cervicalgia 06/19/2017   History reviewed. No pertinent past medical history. History reviewed. No pertinent family history. History reviewed. No pertinent surgical history. Social History   Occupational History  . Not on file  Tobacco Use  . Smoking status: Current Every Day Smoker  . Smokeless tobacco: Never Used  Substance and Sexual Activity  . Alcohol use: Not on file  . Drug use: Not on file  . Sexual activity: Not on file

## 2018-01-10 ENCOUNTER — Other Ambulatory Visit (INDEPENDENT_AMBULATORY_CARE_PROVIDER_SITE_OTHER): Payer: Self-pay | Admitting: Physical Medicine and Rehabilitation

## 2018-01-10 MED ORDER — DIAZEPAM 5 MG PO TABS: ORAL_TABLET | ORAL | 0 refills | Status: DC

## 2018-01-10 NOTE — Progress Notes (Signed)
Pre-procedure diazepam ordered for pre-operative anxiety.  

## 2018-01-14 ENCOUNTER — Ambulatory Visit (INDEPENDENT_AMBULATORY_CARE_PROVIDER_SITE_OTHER): Payer: BLUE CROSS/BLUE SHIELD | Admitting: Physical Medicine and Rehabilitation

## 2018-01-14 ENCOUNTER — Encounter (INDEPENDENT_AMBULATORY_CARE_PROVIDER_SITE_OTHER): Payer: Self-pay | Admitting: Physical Medicine and Rehabilitation

## 2018-01-14 ENCOUNTER — Ambulatory Visit (INDEPENDENT_AMBULATORY_CARE_PROVIDER_SITE_OTHER): Payer: Self-pay

## 2018-01-14 VITALS — BP 97/65 | HR 86 | Temp 98.2°F

## 2018-01-14 DIAGNOSIS — M5412 Radiculopathy, cervical region: Secondary | ICD-10-CM

## 2018-01-14 MED ORDER — METHYLPREDNISOLONE ACETATE 80 MG/ML IJ SUSP: 80.0000 mg | Freq: Once | INTRAMUSCULAR | Status: DC

## 2018-01-14 NOTE — Patient Instructions (Signed)

## 2018-01-14 NOTE — Progress Notes (Signed)
 .  Numeric Pain Rating Scale and Functional Assessment Average Pain 8   In the last MONTH (on 0-10 scale) has pain interfered with the following?  1. General activity like being  able to carry out your everyday physical activities such as walking, climbing stairs, carrying groceries, or moving a chair?  Rating(5)   +Driver, -BT, -Dye Allergies.  

## 2018-01-24 ENCOUNTER — Other Ambulatory Visit: Payer: Self-pay | Admitting: Family

## 2018-01-25 ENCOUNTER — Encounter: Payer: Self-pay | Admitting: Family

## 2018-01-25 NOTE — Procedures (Signed)
Cervical Epidural Steroid Injection - Interlaminar Approach with Fluoroscopic Guidance  Patient: Stacy Rogers      Date of Birth: 1985/10/28 MRN: [id number] PCP: Benita Stabile, MD      Visit Date: 01/14/2018   Universal Protocol:    Date/Time: 10/18/196:12 AM  Consent Given By: the patient  Position: PRONE  Additional Comments: Vital signs were monitored before and after the procedure. Patient was prepped and draped in the usual sterile fashion. The correct patient, procedure, and site was verified.   Injection Procedure Details:  Procedure Site One Meds Administered:  Meds ordered this encounter  Medications  . methylPREDNISolone acetate (DEPO-MEDROL) injection 80 mg     Laterality: Left  Location/Site: C7-T1  Needle size: 20 G  Needle type: Touhy  Needle Placement: Paramedian epidural space  Findings:  -Comments: Excellent flow of contrast into the epidural space.  Procedure Details: Using a paramedian approach from the side mentioned above, the region overlying the inferior lamina was localized under fluoroscopic visualization and the soft tissues overlying this structure were infiltrated with 4 ml. of 1% Lidocaine without Epinephrine. A # 20 gauge, Tuohy needle was inserted into the epidural space using a paramedian approach.  The epidural space was localized using loss of resistance along with lateral and contralateral oblique bi-planar fluoroscopic views.  After negative aspirate for air, blood, and CSF, a 2 ml. volume of Isovue-250 was injected into the epidural space and the flow of contrast was observed. Radiographs were obtained for documentation purposes.   The injectate was administered into the level noted above.  Additional Comments:  The patient tolerated the procedure well Dressing: Band-Aid    Post-procedure details: Patient was observed during the procedure. Post-procedure instructions were reviewed.  Patient left the clinic in stable  condition.

## 2018-01-25 NOTE — Progress Notes (Signed)
Stacy Rogers - 32 y.o. female MRN [id number]  Date of birth: March 06, 1986  Office Visit Note: Visit Date: 01/14/2018 PCP: Benita Stabile, MD Referred by: Benita Stabile, MD  Subjective: Chief Complaint  Patient presents with  . Neck - Pain  . Left Arm - Pain, Numbness, Tingling   HPI:  Stacy Rogers is a 32 y.o. female who comes in today Plan repeat left C7-T1 interlaminar epidural steroid injection.  Please see our prior evaluation and management note for further details and justification.  ROS Otherwise per HPI.  Assessment & Plan: Visit Diagnoses:  1. Cervical radiculopathy     Plan: No additional findings.   Meds & Orders:  Meds ordered this encounter  Medications  . methylPREDNISolone acetate (DEPO-MEDROL) injection 80 mg    Orders Placed This Encounter  Procedures  . XR C-ARM NO REPORT  . Epidural Steroid injection    Follow-up: Return if symptoms worsen or fail to improve.   Procedures: No procedures performed  Cervical Epidural Steroid Injection - Interlaminar Approach with Fluoroscopic Guidance  Patient: Stacy Rogers      Date of Birth: 11-22-1985 MRN: [id number] PCP: Benita Stabile, MD      Visit Date: 01/14/2018   Universal Protocol:    Date/Time: 10/18/196:12 AM  Consent Given By: the patient  Position: PRONE  Additional Comments: Vital signs were monitored before and after the procedure. Patient was prepped and draped in the usual sterile fashion. The correct patient, procedure, and site was verified.   Injection Procedure Details:  Procedure Site One Meds Administered:  Meds ordered this encounter  Medications  . methylPREDNISolone acetate (DEPO-MEDROL) injection 80 mg     Laterality: Left  Location/Site: C7-T1  Needle size: 20 G  Needle type: Touhy  Needle Placement: Paramedian epidural space  Findings:  -Comments: Excellent flow of contrast into the epidural space.  Procedure Details: Using a paramedian approach from the  side mentioned above, the region overlying the inferior lamina was localized under fluoroscopic visualization and the soft tissues overlying this structure were infiltrated with 4 ml. of 1% Lidocaine without Epinephrine. A # 20 gauge, Tuohy needle was inserted into the epidural space using a paramedian approach.  The epidural space was localized using loss of resistance along with lateral and contralateral oblique bi-planar fluoroscopic views.  After negative aspirate for air, blood, and CSF, a 2 ml. volume of Isovue-250 was injected into the epidural space and the flow of contrast was observed. Radiographs were obtained for documentation purposes.   The injectate was administered into the level noted above.  Additional Comments:  The patient tolerated the procedure well Dressing: Band-Aid    Post-procedure details: Patient was observed during the procedure. Post-procedure instructions were reviewed.  Patient left the clinic in stable condition.    Clinical History: MRI CERVICAL SPINE WITHOUT CONTRAST  TECHNIQUE: Multiplanar, multisequence MR imaging of the cervical spine was performed. No intravenous contrast was administered.  COMPARISON:  None.  FINDINGS: Alignment: Normal  Vertebrae: Normal  Cord: Normal  Posterior Fossa, vertebral arteries, paraspinal tissues: Normal  Disc levels:  No abnormality at the foramen magnum, C1-2 or C2-3.  At C3-4, there is a tiny left posterolateral to foraminal disc protrusion that could cause focal irritation of the left C4 nerve. Nerve compression is not demonstrated however.  No abnormality at C4-5.  C5-6 shows minimal desiccation and bulging of the disc but no stenosis.  C6-7 shows minimal disc bulging but no stenosis.  C7-T1 and T1-2 are  normal.  IMPRESSION: Very small left posterolateral disc protrusion at C3-4 with proximal foraminal encroachment that could possibly be associated with irritation of the  left C4 nerve.   Electronically Signed   By: Paulina Fusi M.D.   On: 08/02/2017 20:07     Objective:  VS:  HT:    WT:   BMI:     BP:97/65  HR:86bpm  TEMP:98.2 F (36.8 C)(Oral)  RESP:  Physical Exam  Ortho Exam Imaging: No results found.

## 2018-05-03 ENCOUNTER — Telehealth (INDEPENDENT_AMBULATORY_CARE_PROVIDER_SITE_OTHER): Payer: Self-pay | Admitting: Orthopaedic Surgery
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
## 2018-05-06 NOTE — Telephone Encounter (Signed)
Dr. Conchita Paris.  Thanks.

## 2018-05-06 NOTE — Telephone Encounter (Signed)
Called patient she states inj didn't really help much and  would like to be referred to surgeon? Please advise.  CB [phone]

## 2018-05-07 ENCOUNTER — Other Ambulatory Visit (INDEPENDENT_AMBULATORY_CARE_PROVIDER_SITE_OTHER): Payer: Self-pay

## 2018-05-07 DIAGNOSIS — M542 Cervicalgia: Secondary | ICD-10-CM

## 2018-05-07 NOTE — Telephone Encounter (Signed)
Order made. Someone will call her to schedule appt.

## 2018-05-11 HISTORY — PX: NECK SURGERY: SHX720

## 2018-05-30 ENCOUNTER — Observation Stay (HOSPITAL_COMMUNITY)
Admission: AD | Admit: 2018-05-30 | Discharge: 2018-05-31 | Disposition: A | Discharge status: Home / Self Care | Payer: BLUE CROSS/BLUE SHIELD | Source: Ambulatory Visit | Attending: Neurosurgery | Admitting: Neurosurgery

## 2018-05-30 DIAGNOSIS — M5011 Cervical disc disorder with radiculopathy,  high cervical region: Secondary | ICD-10-CM | POA: Diagnosis not present

## 2018-05-30 DIAGNOSIS — R1310 Dysphagia, unspecified: Secondary | ICD-10-CM | POA: Insufficient documentation

## 2018-05-30 DIAGNOSIS — R2681 Unsteadiness on feet: Secondary | ICD-10-CM | POA: Insufficient documentation

## 2018-05-30 DIAGNOSIS — J45909 Unspecified asthma, uncomplicated: Secondary | ICD-10-CM | POA: Insufficient documentation

## 2018-05-30 DIAGNOSIS — Z79899 Other long term (current) drug therapy: Secondary | ICD-10-CM | POA: Diagnosis not present

## 2018-05-30 DIAGNOSIS — F17200 Nicotine dependence, unspecified, uncomplicated: Secondary | ICD-10-CM | POA: Diagnosis not present

## 2018-05-30 DIAGNOSIS — M5010 Cervical disc disorder with radiculopathy, unspecified cervical region: Secondary | ICD-10-CM

## 2018-05-30 DIAGNOSIS — Z886 Allergy status to analgesic agent status: Secondary | ICD-10-CM | POA: Insufficient documentation

## 2018-05-30 DIAGNOSIS — F411 Generalized anxiety disorder: Secondary | ICD-10-CM | POA: Diagnosis not present

## 2018-05-30 DIAGNOSIS — Z791 Long term (current) use of non-steroidal anti-inflammatories (NSAID): Secondary | ICD-10-CM | POA: Insufficient documentation

## 2018-05-30 DIAGNOSIS — F329 Major depressive disorder, single episode, unspecified: Secondary | ICD-10-CM | POA: Diagnosis not present

## 2018-05-30 DIAGNOSIS — M5412 Radiculopathy, cervical region: Secondary | ICD-10-CM | POA: Diagnosis present

## 2018-05-30 MED ORDER — BUPROPION HCL ER (SR) 150 MG PO TB12
150.0000 mg | ORAL_TABLET | Freq: Two times a day (BID) | ORAL | Status: DC
  Administered 2018-05-30 – 2018-05-31 (×2): 150 mg via ORAL
  Filled 2018-05-30 (×2): qty 1

## 2018-05-30 MED ORDER — MENTHOL 3 MG MT LOZG
1.0000 | LOZENGE | OROMUCOSAL | Status: DC | PRN
  Administered 2018-05-31: 3 mg via ORAL

## 2018-05-30 MED ORDER — ONDANSETRON HCL 4 MG/2ML IJ SOLN: 4.0000 mg | Freq: Four times a day (QID) | INTRAMUSCULAR | Status: DC | PRN

## 2018-05-30 MED ORDER — HYDROMORPHONE HCL 1 MG/ML IJ SOLN: 1.0000 mg | INTRAMUSCULAR | Status: DC | PRN

## 2018-05-30 MED ORDER — MONTELUKAST SODIUM 10 MG PO TABS
10.0000 mg | ORAL_TABLET | Freq: Every day | ORAL | Status: DC
  Administered 2018-05-30: 10 mg via ORAL
  Filled 2018-05-30: qty 1

## 2018-05-30 MED ORDER — FLUTICASONE PROPIONATE 50 MCG/ACT NA SUSP
2.0000 | Freq: Every day | NASAL | Status: DC
  Filled 2018-05-30: qty 16

## 2018-05-30 MED ORDER — PHENOL 1.4 % MT LIQD: 1.0000 | OROMUCOSAL | Status: DC | PRN

## 2018-05-30 MED ORDER — HYDROMORPHONE HCL 1 MG/ML IJ SOLN
INTRAMUSCULAR | Status: AC
  Administered 2018-05-30: 1 mg
  Filled 2018-05-30: qty 1

## 2018-05-30 MED ORDER — SODIUM CHLORIDE 0.9 % IV SOLN
INTRAVENOUS | Status: DC
  Administered 2018-05-30: 20:00:00 via INTRAVENOUS

## 2018-05-30 MED ORDER — SENNOSIDES-DOCUSATE SODIUM 8.6-50 MG PO TABS: 1.0000 | ORAL_TABLET | Freq: Every evening | ORAL | Status: DC | PRN

## 2018-05-30 MED ORDER — DOCUSATE SODIUM 100 MG PO CAPS
100.0000 mg | ORAL_CAPSULE | Freq: Two times a day (BID) | ORAL | Status: DC
  Administered 2018-05-31: 100 mg via ORAL
  Filled 2018-05-30 (×2): qty 1

## 2018-05-30 MED ORDER — OXYCODONE HCL 5 MG PO TABS
5.0000 mg | ORAL_TABLET | ORAL | Status: DC | PRN
  Administered 2018-05-31: 10 mg via ORAL
  Filled 2018-05-30: qty 2

## 2018-05-30 MED ORDER — SENNA 8.6 MG PO TABS
1.0000 | ORAL_TABLET | Freq: Two times a day (BID) | ORAL | Status: DC
  Administered 2018-05-31: 8.6 mg via ORAL
  Filled 2018-05-30 (×2): qty 1

## 2018-05-30 MED ORDER — HYDROCODONE-ACETAMINOPHEN 5-325 MG PO TABS
1.0000 | ORAL_TABLET | ORAL | Status: DC | PRN
  Administered 2018-05-31: 2 via ORAL
  Filled 2018-05-30: qty 2

## 2018-05-30 MED ORDER — ALBUTEROL SULFATE (2.5 MG/3ML) 0.083% IN NEBU: 3.0000 mL | INHALATION_SOLUTION | Freq: Two times a day (BID) | RESPIRATORY_TRACT | Status: DC | PRN

## 2018-05-30 MED ORDER — ACETAMINOPHEN 650 MG RE SUPP: 650.0000 mg | RECTAL | Status: DC | PRN

## 2018-05-30 MED ORDER — HYDROMORPHONE HCL 1 MG/ML IJ SOLN
1.0000 mg | INTRAMUSCULAR | Status: DC | PRN
  Administered 2018-05-30 – 2018-05-31 (×5): 1 mg via INTRAVENOUS
  Filled 2018-05-30 (×5): qty 1

## 2018-05-30 MED ORDER — FLEET ENEMA 7-19 GM/118ML RE ENEM: 1.0000 | ENEMA | Freq: Once | RECTAL | Status: DC | PRN

## 2018-05-30 MED ORDER — SODIUM CHLORIDE 0.9% FLUSH
3.0000 mL | Freq: Two times a day (BID) | INTRAVENOUS | Status: DC
  Administered 2018-05-30 – 2018-05-31 (×2): 3 mL via INTRAVENOUS

## 2018-05-30 MED ORDER — METHOCARBAMOL 500 MG PO TABS
500.0000 mg | ORAL_TABLET | Freq: Four times a day (QID) | ORAL | Status: DC | PRN
  Administered 2018-05-31: 500 mg via ORAL
  Filled 2018-05-30: qty 1

## 2018-05-30 MED ORDER — NICOTINE 21 MG/24HR TD PT24
21.0000 mg | MEDICATED_PATCH | Freq: Every day | TRANSDERMAL | Status: DC
  Administered 2018-05-31: 21 mg via TRANSDERMAL
  Filled 2018-05-30: qty 1

## 2018-05-30 MED ORDER — SODIUM CHLORIDE 0.9% FLUSH: 3.0000 mL | INTRAVENOUS | Status: DC | PRN

## 2018-05-30 MED ORDER — ESCITALOPRAM OXALATE 10 MG PO TABS
20.0000 mg | ORAL_TABLET | Freq: Every day | ORAL | Status: DC
  Administered 2018-05-31: 20 mg via ORAL
  Filled 2018-05-30: qty 2

## 2018-05-30 MED ORDER — ONDANSETRON HCL 4 MG PO TABS
4.0000 mg | ORAL_TABLET | Freq: Four times a day (QID) | ORAL | Status: DC | PRN
  Administered 2018-05-31: 4 mg via ORAL
  Filled 2018-05-30: qty 1

## 2018-05-30 MED ORDER — WHITE PETROLATUM EX OINT
TOPICAL_OINTMENT | CUTANEOUS | Status: AC
  Administered 2018-05-30: 18:00:00
  Filled 2018-05-30: qty 28.35

## 2018-05-30 MED ORDER — METHOCARBAMOL 1000 MG/10ML IJ SOLN
500.0000 mg | Freq: Four times a day (QID) | INTRAVENOUS | Status: DC | PRN
  Filled 2018-05-30: qty 5

## 2018-05-30 MED ORDER — ACETAMINOPHEN 500 MG PO TABS
1000.0000 mg | ORAL_TABLET | Freq: Four times a day (QID) | ORAL | Status: DC
  Administered 2018-05-31: 1000 mg via ORAL
  Filled 2018-05-30: qty 2

## 2018-05-30 MED ORDER — SODIUM CHLORIDE 0.9 % IV SOLN: 250.0000 mL | INTRAVENOUS | Status: DC

## 2018-05-30 MED ORDER — BISACODYL 10 MG RE SUPP: 10.0000 mg | Freq: Every day | RECTAL | Status: DC | PRN

## 2018-05-30 MED ORDER — ACETAMINOPHEN 325 MG PO TABS: 650.0000 mg | ORAL_TABLET | ORAL | Status: DC | PRN

## 2018-05-30 NOTE — Progress Notes (Signed)
No void this shift. Bladder scan noted 49ml. Bladder does not feel distended and patient has no urge to void. Olevia Perches

## 2018-05-30 NOTE — H&P (Addendum)
Chief Complaint   Neck pain  HPI   HPI: Stacy Rogers is a 33 y.o. 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 Intraoperatively, there was concern over arterial injury so patient was transferred to [facility] for observation overnight.  Patient Active Problem List   Diagnosis Date Noted  . Cervical disc disorder with radiculopathy of cervical region 05/30/2018  . Radiculopathy, cervical region 08/07/2017  . Cervicalgia 06/19/2017  . Depressed 12/28/2016  . Asthma 10/27/2016  . Current smoker 10/27/2016  . Oral herpes 10/27/2016  . GAD (generalized anxiety disorder) 10/27/2016  . Underweight 10/27/2016    PMH: Past Medical History:  Diagnosis Date  . Asthma     PSH: No past surgical history on file.  No medications prior to admission.    SH: Social History   Tobacco Use  . Smoking status: Current Every Day Smoker  . Smokeless tobacco: Never Used  Substance Use Topics  . Alcohol use: No  . Drug use: No    MEDS: Prior to Admission medications   Medication Sig Start Date End Date Taking? Authorizing Provider  acyclovir (ZOVIRAX) 400 MG tablet Take 1 tablet (400 mg total) by mouth 3 (three) times daily as needed. 06/20/17   Junie Spencer, FNP  acyclovir (ZOVIRAX) 400 MG tablet  06/20/17   [provider]  albuterol (PROVENTIL HFA;VENTOLIN HFA) 108 (90 Base) MCG/ACT inhaler Inhale 2 puffs into the lungs 2 (two) times daily as needed for wheezing or shortness of breath. 10/27/16   Jannifer Rodney A, FNP  amoxicillin-clavulanate (AUGMENTIN) 875-125 MG tablet Take 1 tablet by mouth 2 (two) times daily. 12/28/16   Junie Spencer, FNP  azithromycin (ZITHROMAX) 250 MG tablet  08/09/17   [provider]  buPROPion (WELLBUTRIN SR) 150 MG 12 hr tablet Take 1 tablet (150 mg total) by mouth 2 (two) times daily. 12/28/16   Junie Spencer, FNP  cyclobenzaprine (FLEXERIL) 10 MG tablet  06/05/17    [provider]  diazepam (VALIUM) 5 MG tablet Take 1 by mouth 1 to 2 hours pre-procedure. May repeat if necessary. 01/10/18   Tyrell Antonio, MD  diclofenac (VOLTAREN) 75 MG EC tablet  06/06/17   [provider]  escitalopram (LEXAPRO) 20 MG tablet Take 1 tablet (20 mg total) by mouth daily. 12/05/16   Junie Spencer, FNP  fluticasone (FLONASE) 50 MCG/ACT nasal spray Place 2 sprays into both nostrils daily. 12/28/16   Junie Spencer, FNP  gabapentin (NEURONTIN) 300 MG capsule  06/11/17   [provider]  HYDROcodone-acetaminophen (NORCO/VICODIN) 5-325 MG tablet  06/05/17   [provider]  metroNIDAZOLE (FLAGYL) 500 MG tablet  07/10/17   [provider]  montelukast (SINGULAIR) 10 MG tablet Take 1 tablet (10 mg total) by mouth at bedtime. 10/27/16   Junie Spencer, FNP  predniSONE (STERAPRED UNI-PAK 21 TAB) 10 MG (21) TBPK tablet Use as directed 12/28/16   Jannifer Rodney A, FNP  predniSONE (STERAPRED UNI-PAK 21 TAB) 10 MG (21) TBPK tablet Take as directed 06/19/17   Cristie Hem, PA-C  predniSONE (STERAPRED UNI-PAK 21 TAB) 10 MG (21) TBPK tablet Take as directed 06/20/17   Cristie Hem, PA-C  traMADol Janean Sark) 50 MG tablet Take 1-2 tabs po q6-8 hours prn pain 12/21/17   Tarry Kos, MD    ALLERGY: Allergies  Allergen Reactions  . Aspirin Swelling    Only to the lips  . Aspirin Swelling  Blisters    Social History   Tobacco Use  . Smoking status: Current Every Day Smoker  . Smokeless tobacco: Never Used  Substance Use Topics  . Alcohol use: No     Family History  Problem Relation Age of Onset  . COPD Mother   . Alcohol abuse Father      ROS   ROS  Exam   There were no vitals filed for this visit. General appearance: WDWN, NAD Eyes: No scleral injection Cardiovascular: Regular rate and rhythm without murmurs, rubs, gallops. No edema or variciosities. Distal pulses normal. Pulmonary: Effort normal, non-labored  breathing Musculoskeletal:     Muscle tone upper extremities: Normal    Muscle tone lower extremities: Normal    Motor exam: Upper Extremities Deltoid Bicep Tricep Grip  Right 5/5 5/5 5/5 5/5  Left 5/5 5/5 5/5 5/5   Lower Extremity IP Quad PF DF EHL  Right 5/5 5/5 5/5 5/5 5/5  Left 5/5 5/5 5/5 5/5 5/5   Neurological Mental Status:    - Patient is awake, alert, oriented to person, place, month, year, and situation    - Patient is able to give a clear and coherent history.    - No signs of aphasia or neglect Cranial Nerves    - II: Visual Fields are full. PERRL    - III/IV/VI: EOMI without ptosis or diploplia.     - V: Facial sensation is grossly normal    - VII: Facial movement is symmetric.     - VIII: hearing is intact to voice    - X: Uvula elevates symmetrically    - XI: Shoulder shrug is symmetric.    - XII: tongue is midline without atrophy or fasciculations.  Sensory: Sensation grossly intact to LT   Results - Imaging/Labs   No results found for this or any previous visit (from the past 48 hour(s)).  No results found.  Impression/Plan   33 y.o. female with possible arterial injury intraoperatively during elective C3-4 arthroplasty. We will admit for observation overnight in stepdown unit.  - Q 2 hours neuro check, report any change - CTA neck in am

## 2018-05-31 ENCOUNTER — Encounter (HOSPITAL_COMMUNITY): Payer: Self-pay | Admitting: General Practice

## 2018-05-31 ENCOUNTER — Observation Stay (HOSPITAL_COMMUNITY): Payer: BLUE CROSS/BLUE SHIELD

## 2018-05-31 DIAGNOSIS — M5011 Cervical disc disorder with radiculopathy,  high cervical region: Secondary | ICD-10-CM | POA: Diagnosis not present

## 2018-05-31 LAB — CBC
HCT: 38.2 % (ref 36.0–46.0)
Hemoglobin: 12.2 g/dL (ref 12.0–15.0)
MCH: 30.5 pg (ref 26.0–34.0)
MCHC: 31.9 g/dL (ref 30.0–36.0)
MCV: 95.5 fL (ref 80.0–100.0)
Platelets: 168 10*3/uL (ref 150–400)
RBC: 4 MIL/uL (ref 3.87–5.11)
RDW: 12.1 % (ref 11.5–15.5)
WBC: 12.5 10*3/uL — ABNORMAL HIGH (ref 4.0–10.5)
nRBC: 0 % (ref 0.0–0.2)

## 2018-05-31 LAB — BASIC METABOLIC PANEL
Anion gap: 9 (ref 5–15)
BUN: 5 mg/dL — ABNORMAL LOW (ref 6–20)
CO2: 27 mmol/L (ref 22–32)
Calcium: 8.9 mg/dL (ref 8.9–10.3)
Chloride: 101 mmol/L (ref 98–111)
Creatinine, Ser: 0.65 mg/dL (ref 0.44–1.00)
GFR calc Af Amer: >60 mL/min (ref >60)
GFR calc non Af Amer: >60 mL/min (ref >60)
Glucose, Bld: 135 mg/dL — ABNORMAL HIGH (ref 70–99)
Potassium: 4.1 mmol/L (ref 3.5–5.1)
Sodium: 137 mmol/L (ref 135–145)

## 2018-05-31 LAB — PROTIME-INR
INR: 1.11
Prothrombin Time: 14.2 seconds (ref 11.4–15.2)

## 2018-05-31 LAB — APTT: aPTT: 31 seconds (ref 24–36)

## 2018-05-31 MED ORDER — CYCLOBENZAPRINE HCL 10 MG PO TABS: 10.0000 mg | ORAL_TABLET | Freq: Three times a day (TID) | ORAL | 0 refills | Status: DC | PRN

## 2018-05-31 MED ORDER — OXYCODONE-ACETAMINOPHEN 7.5-325 MG PO TABS: 1.0000 | ORAL_TABLET | ORAL | 0 refills | Status: DC | PRN

## 2018-05-31 MED ORDER — DICLOFENAC SODIUM 75 MG PO TBEC: 75.0000 mg | DELAYED_RELEASE_TABLET | Freq: Two times a day (BID) | ORAL | 0 refills | Status: DC

## 2018-05-31 MED ORDER — IOPAMIDOL (ISOVUE-370) INJECTION 76%
75.0000 mL | Freq: Once | INTRAVENOUS | Status: AC | PRN
  Administered 2018-05-31: 75 mL via INTRAVENOUS

## 2018-05-31 NOTE — Progress Notes (Signed)
CT angiogram reviewed, no evidence of vertebral artery pseudoaneurysm or dissection. Can d/c home as planned.

## 2018-05-31 NOTE — Progress Notes (Signed)
OT Cancellation    05/31/18 1000  OT Visit Information  Last OT Received On 05/31/18  Reason Eval/Treat Not Completed Patient at procedure or test/ unavailable (CT)  Luisa Dago, OT/L   Acute OT Clinical Specialist Acute Rehabilitation Services Pager [phone] Office [phone]

## 2018-05-31 NOTE — Progress Notes (Signed)
Orthopedic Tech Progress Note Patient Details:  Stacy Rogers 06-05-1985 [id number] Received a page requesting a soft collar Ortho Devices Type of Ortho Device: Soft collar Ortho Device/Splint Interventions: Adjustment, Application, Ordered   Post Interventions Patient Tolerated: Well Instructions Provided: Care of device, Adjustment of device   Donald Pore 05/31/2018, 8:31 AM

## 2018-05-31 NOTE — Progress Notes (Signed)
OT Evaluation  Completed education regarding compensatory strategies and cervical precautions for ADL and functional mobility for ADL. Pt verbalized understanding. Written handout reviewed. No further OT needed.     05/31/18 1100  OT Visit Information  Last OT Received On 05/31/18  Assistance Needed +1  History of Present Illness pt is a 33yo female admitted after C3-4 arthroplasty (05/30/18). PMH: asthma, anxiety, depression   Precautions  Precautions Cervical  Precaution Booklet Issued Yes (comment)  Required Braces or Orthoses Cervical Brace  Cervical Brace Soft collar  Home Living  Family/patient expects to be discharged to: Private residence  Living Arrangements Spouse/significant other;Children  Available Help at Discharge Family;Available 24 hours/day  Type of Home House  Home Access Stairs to enter  Entrance Stairs-Number of Steps 1  Home Layout Two level;Able to live on main level with bedroom/bathroom  Barrister's clerk Accessibility Yes  How Accessible Accessible via walker  Home Equipment None  Additional Comments Have a shower chair they can "borrow"  Prior Function  Level of Independence Independent  Comments numbness/weakness LUE PTA  Communication  Communication No difficulties  Pain Assessment  Pain Assessment 0-10  Pain Score 8  Pain Location neck  Pain Descriptors / Indicators Aching;Constant  Pain Intervention(s) Premedicated before session;Limited activity within patient's tolerance  Cognition  Arousal/Alertness Awake/alert  Behavior During Therapy WFL for tasks assessed/performed  Overall Cognitive Status Within Functional Limits for tasks assessed  Upper Extremity Assessment  Upper Extremity Assessment Overall WFL for tasks assessed  Lower Extremity Assessment  Lower Extremity Assessment Defer to PT evaluation  Cervical / Trunk Assessment  Cervical / Trunk Assessment Other exceptions  Cervical / Trunk Exceptions post surgical pt  with shaking with movement but reports she feels it is due to medication  ADL  Overall ADL's  Needs assistance/impaired  Functional mobility during ADLs Supervision/safety (unsteady; most likley due to meds)  General ADL Comments Educated pt on compoensatory strategies for ADL and functional mobility for ADL. Educated on home safety and reducing risk of falls. Recommend pt use a reacher to retrieve items from floor. Son/husband will be able to assist as needed.   Bed Mobility  Overal bed mobility Needs Assistance  General bed mobility comments Educated on proper technique; reminded pt of option to sleep in recliner if bed mobility too painful  Transfers  Overall transfer level Modified independent  OT - End of Session  Activity Tolerance Patient tolerated treatment well  Patient left in bed;with call bell/phone within reach;with family/visitor present  Nurse Communication Mobility status  OT Assessment  OT Recommendation/Assessment Patient does not need any further OT services  OT Visit Diagnosis Pain  Pain - part of body  (neck)  OT Problem List Decreased knowledge of precautions;Pain  AM-PAC OT "6 Clicks" Daily Activity Outcome Measure (Version 2)  Help from another person eating meals? 4  Help from another person taking care of personal grooming? 4  Help from another person toileting, which includes using toliet, bedpan, or urinal? 4  Help from another person bathing (including washing, rinsing, drying)? 4  Help from another person to put on and taking off regular upper body clothing? 3  Help from another person to put on and taking off regular lower body clothing? 4  6 Click Score 23  OT Recommendation  Follow Up Recommendations No OT follow up;Supervision - Intermittent  OT Equipment None recommended by OT  Acute Rehab OT Goals  Patient Stated Goal return home  OT Goal Formulation All  assessment and education complete, DC therapy  OT Time Calculation  OT Start Time (ACUTE  ONLY) 1130  OT Stop Time (ACUTE ONLY) 1145  OT Time Calculation (min) 15 min  OT General Charges  $OT Visit 1 Visit  OT Evaluation  $OT Eval Low Complexity 1 Low  Written Expression  Dominant Hand Right  Luisa Dago, OT/L   Acute OT Clinical Specialist Acute Rehabilitation Services Pager [phone] Office [phone]

## 2018-05-31 NOTE — Discharge Summary (Signed)
Physician Discharge Summary  Patient ID: Stacy Rogers MRN: [id number] DOB/AGE: 1985/07/17 33 y.o.  Admit date: 05/30/2018 Discharge date: 05/31/2018  Admission Diagnoses:  Cervical radiculopathy Possible arterial injury  Discharge Diagnoses:  Same Active Problems:   Cervical disc disorder with radiculopathy of cervical region   Cervical radiculopathy  Discharged Condition: Stable  Hospital Course:  Stacy Rogers is a 33 y.o. female who was admitted directly from outpatient surgery center for elective C3-4 arthroplasty.  Intraoperatively, there was concern over arterial injury so patient was transferred to [facility] for observation overnight. She remained neurologically intact throughout observation period. There was no evidence of bleeding. CTA without evidence of arterial injury. Cleared for d/c home. Strong return precautions discussed.  Treatments: Surgery - outpt C3-4 arthroplasty   Discharge Exam: Blood pressure 101/73, pulse 62, temperature 98.3 F (36.8 C), temperature source Oral, resp. rate 17, height 5\' 4"  (1.626 m), weight 47.6 kg, SpO2 91 %. Awake, alert, oriented Speech fluent, appropriate CN grossly intact 5/5 BUE/BLE Wound c/d/i  Disposition:    Allergies as of 05/31/2018      Reactions   Aspirin Swelling   Only to the lips   Aspirin Swelling   Blisters      Medication List    STOP taking these medications   amoxicillin-clavulanate 875-125 MG tablet Commonly known as:  AUGMENTIN   azithromycin 250 MG tablet Commonly known as:  ZITHROMAX   metroNIDAZOLE 500 MG tablet Commonly known as:  FLAGYL   predniSONE 10 MG (21) Tbpk tablet Commonly known as:  STERAPRED UNI-PAK 21 TAB   traMADol 50 MG tablet Commonly known as:  ULTRAM     TAKE these medications   acyclovir 400 MG tablet Commonly known as:  ZOVIRAX Take 1 tablet (400 mg total) by mouth 3 (three) times daily as needed.   acyclovir 400 MG tablet Commonly known as:  ZOVIRAX    albuterol 108 (90 Base) MCG/ACT inhaler Commonly known as:  PROVENTIL HFA;VENTOLIN HFA Inhale 2 puffs into the lungs 2 (two) times daily as needed for wheezing or shortness of breath.   buPROPion 150 MG 12 hr tablet Commonly known as:  WELLBUTRIN SR Take 1 tablet (150 mg total) by mouth 2 (two) times daily.   cyclobenzaprine 10 MG tablet Commonly known as:  FLEXERIL Take 1 tablet (10 mg total) by mouth 3 (three) times daily as needed for muscle spasms. What changed:    how much to take  how to take this  when to take this  reasons to take this   diazepam 5 MG tablet Commonly known as:  VALIUM Take 1 by mouth 1 to 2 hours pre-procedure. May repeat if necessary.   diclofenac 75 MG EC tablet Commonly known as:  VOLTAREN Take 1 tablet (75 mg total) by mouth 2 (two) times daily. Start taking on:  June 06, 2018 What changed:    how much to take  how to take this  when to take this  These instructions start on June 06, 2018. If you are unsure what to do until then, ask your doctor or other care provider.   escitalopram 20 MG tablet Commonly known as:  LEXAPRO Take 1 tablet (20 mg total) by mouth daily.   fluticasone 50 MCG/ACT nasal spray Commonly known as:  FLONASE Place 2 sprays into both nostrils daily.   gabapentin 300 MG capsule Commonly known as:  NEURONTIN   HYDROcodone-acetaminophen 5-325 MG tablet Commonly known as:  NORCO/VICODIN   montelukast 10 MG tablet Commonly  known as:  SINGULAIR Take 1 tablet (10 mg total) by mouth at bedtime.   oxyCODONE-acetaminophen 7.5-325 MG tablet Commonly known as:  PERCOCET Take 1 tablet by mouth every 4 (four) hours as needed.      Follow-up Information    Lisbeth Renshaw, MD. Schedule an appointment as soon as possible for a visit in 3 week(s).   Specialty:  Neurosurgery Contact information: [address] [place] [address] [phone]           Signed: Alyson Ingles 05/31/2018, 8:08 AM

## 2018-05-31 NOTE — Evaluation (Signed)
Physical Therapy Evaluation Patient Details Name: Stacy Rogers MRN: [id number] DOB: 10-24-1985 Today's Date: 05/31/2018   History of Present Illness  pt is a 33yo female admitted after C3-4 arthroplasty (05/30/18) and concern over arterial injury. PMH: asthma, anxiety, depression   Clinical Impression  Pt is a 33yo female admitted for above. Pt presents with pain and decreased functional mobility. Pt would benefit from acute skilled therapy to address pain and functional mobility within precautions. Pt educated on precautions and able to recall 1 precaution. Pt educated on plan to see acutely before returning home and pt agreed and consented.     Follow Up Recommendations No PT follow up    Equipment Recommendations  None recommended by PT    Recommendations for Other Services       Precautions / Restrictions Precautions Precautions: Cervical Precaution Booklet Issued: Yes (comment)      Mobility  Bed Mobility Overal bed mobility: Needs Assistance Bed Mobility: Rolling;Sidelying to Sit Rolling: Min guard Sidelying to sit: Min guard       General bed mobility comments: cues for sequence with bed flat and no rail   Transfers Overall transfer level: Needs assistance   Transfers: Sit to/from Stand Sit to Stand: Min guard         General transfer comment: guarding for safety and lines, pt shaking and unsteady on standing  Ambulation/Gait Ambulation/Gait assistance: Supervision Gait Distance (Feet): 225 Feet Assistive device: Rolling walker (2 wheeled);None Gait Pattern/deviations: Step-through pattern;Decreased stride length   Gait velocity interpretation: 1.31 - 2.62 ft/sec, indicative of limited community ambulator General Gait Details: pt initially shaky and reaching out for environmental support with preference for RW for 150' then able to progress to 27' without RW with decreased shaking, slow steady gait with supervision and assist for lines  Stairs             Wheelchair Mobility    Modified Rankin (Stroke Patients Only)       Balance Overall balance assessment: Mild deficits observed, not formally tested                                           Pertinent Vitals/Pain Pain Assessment: 0-10 Pain Score: 8  Pain Location: neck Pain Descriptors / Indicators: Aching;Constant Pain Intervention(s): Limited activity within patient's tolerance;Premedicated before session;Patient requesting pain meds-RN notified;Monitored during session;Repositioned    Home Living Family/patient expects to be discharged to:: Private residence Living Arrangements: Spouse/significant other;Children Available Help at Discharge: Family;Available 24 hours/day Type of Home: House Home Access: Stairs to enter   [employer] of Steps: 1 Home Layout: Two level;Able to live on main level with bedroom/bathroom Home Equipment: None      Prior Function Level of Independence: Independent               Hand Dominance        Extremity/Trunk Assessment   Upper Extremity Assessment Upper Extremity Assessment: Overall WFL for tasks assessed    Lower Extremity Assessment Lower Extremity Assessment: Overall WFL for tasks assessed    Cervical / Trunk Assessment Cervical / Trunk Assessment: Other exceptions Cervical / Trunk Exceptions: post surgical pt with shaking with movement but reports she feels it is due to medication  Communication   Communication: No difficulties  Cognition Arousal/Alertness: Awake/alert Behavior During Therapy: WFL for tasks assessed/performed Overall Cognitive Status: Within Functional Limits for tasks assessed  General Comments      Exercises     Assessment/Plan    PT Assessment Patient needs continued PT services  PT Problem List Decreased activity tolerance;Pain;Decreased mobility;Decreased knowledge of precautions       PT  Treatment Interventions Gait training;Therapeutic activities;Stair training;Therapeutic exercise;DME instruction;Functional mobility training;Balance training;Patient/family education    PT Goals (Current goals can be found in the Care Plan section)  Acute Rehab PT Goals Patient Stated Goal: return home and to work PT Goal Formulation: With patient Time For Goal Achievement: 06/07/18 Potential to Achieve Goals: Good    Frequency Min 5X/week   Barriers to discharge        Co-evaluation               AM-PAC PT "6 Clicks" Mobility  Outcome Measure Help needed turning from your back to your side while in a flat bed without using bedrails?: A Little Help needed moving from lying on your back to sitting on the side of a flat bed without using bedrails?: A Little Help needed moving to and from a bed to a chair (including a wheelchair)?: A Little Help needed standing up from a chair using your arms (e.g., wheelchair or bedside chair)?: A Little Help needed to walk in hospital room?: A Little Help needed climbing 3-5 steps with a railing? : A Little 6 Click Score: 18    End of Session Equipment Utilized During Treatment: Gait belt Activity Tolerance: Patient tolerated treatment well;Patient limited by pain Patient left: in chair;with call bell/phone within reach Nurse Communication: Mobility status;Precautions PT Visit Diagnosis: Unsteadiness on feet (R26.81)    Time: 2992-4268 PT Time Calculation (min) (ACUTE ONLY): 18 min   Charges:   PT Evaluation $PT Eval Moderate Complexity: 1 Mod          Talley Kreiser, [place] [phone]  Bonnell Placzek 05/31/2018, 8:12 AM

## 2018-05-31 NOTE — Progress Notes (Addendum)
  NEUROSURGERY PROGRESS NOTE   No issues overnight. Pre op LUE pain resolved Complains of appropriate neck soreness Mild dysphagia but tolerating regular diet  EXAM:  BP 101/73 (BP Location: Left Arm)   Pulse 62   Temp 98.3 F (36.8 C) (Oral)   Resp 17   Ht 5\' 4"  (1.626 m)   Wt 47.6 kg   SpO2 91%   BMI 18.01 kg/m   Awake, alert, oriented  Speech fluent, appropriate  CN grossly intact  5/5 BUE/BLE  Incision bandage clean and dry. No swelling or palpable hematoma  PLAN Doing well this am CTA pending to r/o arterial injury D/C pending CTA   I have seen and examined Stacy Rogers and agree with the exam, impression, and plan as documented in the note by Cindra Presume, PA-C.  - Doing well postop, resolution of preop radiculopathy - Will check CTA neck for possible VA dissection, if ok will d/c home.  Lisbeth Renshaw, MD [facility]

## 2018-06-12 ENCOUNTER — Other Ambulatory Visit: Payer: Self-pay

## 2018-06-12 DIAGNOSIS — B002 Herpesviral gingivostomatitis and pharyngotonsillitis: Secondary | ICD-10-CM

## 2018-06-13 ENCOUNTER — Other Ambulatory Visit: Payer: Self-pay

## 2018-06-13 DIAGNOSIS — B002 Herpesviral gingivostomatitis and pharyngotonsillitis: Secondary | ICD-10-CM

## 2018-06-24 ENCOUNTER — Other Ambulatory Visit: Payer: Self-pay | Admitting: Neurosurgery

## 2018-06-24 DIAGNOSIS — I726 Aneurysm of vertebral artery: Secondary | ICD-10-CM

## 2018-07-12 ENCOUNTER — Ambulatory Visit
Admission: RE | Admit: 2018-07-12 | Discharge: 2018-07-12 | Disposition: A | Discharge status: Home / Self Care | Payer: BLUE CROSS/BLUE SHIELD | Source: Ambulatory Visit | Attending: Neurosurgery | Admitting: Neurosurgery

## 2018-07-12 ENCOUNTER — Other Ambulatory Visit: Payer: Self-pay

## 2018-07-12 DIAGNOSIS — I726 Aneurysm of vertebral artery: Secondary | ICD-10-CM

## 2018-07-12 MED ORDER — IOPAMIDOL (ISOVUE-370) INJECTION 76%
75.0000 mL | Freq: Once | INTRAVENOUS | Status: AC | PRN
  Administered 2018-07-12: 75 mL via INTRAVENOUS

## 2019-01-07 ENCOUNTER — Other Ambulatory Visit: Payer: Self-pay

## 2019-01-07 ENCOUNTER — Ambulatory Visit (HOSPITAL_COMMUNITY)
Admission: RE | Admit: 2019-01-07 | Discharge: 2019-01-07 | Disposition: A | Discharge status: Home / Self Care | Payer: BC Managed Care – PPO | Source: Ambulatory Visit | Attending: Family Medicine | Admitting: Family Medicine

## 2019-01-07 ENCOUNTER — Other Ambulatory Visit (HOSPITAL_COMMUNITY): Payer: Self-pay | Admitting: Family Medicine

## 2019-01-07 DIAGNOSIS — R634 Abnormal weight loss: Secondary | ICD-10-CM | POA: Diagnosis present

## 2019-01-07 DIAGNOSIS — F17219 Nicotine dependence, cigarettes, with unspecified nicotine-induced disorders: Secondary | ICD-10-CM

## 2019-02-05 ENCOUNTER — Encounter: Payer: Self-pay | Admitting: Gastroenterology

## 2019-02-17 ENCOUNTER — Other Ambulatory Visit: Payer: Self-pay | Admitting: Physician Assistant

## 2019-02-17 DIAGNOSIS — M5412 Radiculopathy, cervical region: Secondary | ICD-10-CM

## 2019-02-19 ENCOUNTER — Ambulatory Visit: Payer: BC Managed Care – PPO | Admitting: Nurse Practitioner

## 2019-02-19 ENCOUNTER — Encounter: Payer: Self-pay | Admitting: Gastroenterology

## 2019-02-19 ENCOUNTER — Other Ambulatory Visit: Payer: Self-pay

## 2019-02-19 ENCOUNTER — Ambulatory Visit (INDEPENDENT_AMBULATORY_CARE_PROVIDER_SITE_OTHER): Payer: BC Managed Care – PPO | Admitting: Gastroenterology

## 2019-02-19 ENCOUNTER — Telehealth: Payer: Self-pay | Admitting: Gastroenterology

## 2019-02-19 DIAGNOSIS — R110 Nausea: Secondary | ICD-10-CM | POA: Diagnosis not present

## 2019-02-19 DIAGNOSIS — R6881 Early satiety: Secondary | ICD-10-CM

## 2019-02-19 DIAGNOSIS — R634 Abnormal weight loss: Secondary | ICD-10-CM | POA: Diagnosis not present

## 2019-02-19 DIAGNOSIS — R630 Anorexia: Secondary | ICD-10-CM

## 2019-02-19 MED ORDER — PANTOPRAZOLE SODIUM 40 MG PO TBEC: 40.0000 mg | DELAYED_RELEASE_TABLET | Freq: Every day | ORAL | 1 refills | Status: DC

## 2019-02-19 NOTE — Telephone Encounter (Signed)
Lmom, waiting on a return call.  

## 2019-02-19 NOTE — Patient Instructions (Signed)
1. Start pantoprazole 40mg  daily before breakfast.  2. Upper endoscopy as scheduled. See separate instructions.  3. I will look at recent labs and let you know if additional labs needed.

## 2019-02-19 NOTE — Telephone Encounter (Signed)
Labs from PCP dated 01/03/2019:  White blood cell count 7200, hemoglobin 15.2, hematocrit 45.4, platelets 207,000, BUN 7, creatinine 0.66, albumin 4.9, total bilirubin 0.5, alkaline phosphatase 47, AST 12, ALT 9, TSH 1.080, T4 8.0, sed rate 6, CRP 3, cortisol 7.1 at 12:04 PM, HIV nonreactive, RPR nonreactive   PLEASE LET PATIENT KNOW I REVIEWED HER LABS. ALL LOOKED GOOD. SHE WAS NOT SCREENED FOR CELIAC.   LET'S HAVE HER GO TO LAB FOR CELIAC SEROLOGIES. ORDERS PLACED. HAVE HER GO TO [facility].

## 2019-02-19 NOTE — Telephone Encounter (Signed)
Pt returned call. Pt is aware that [facility] reviewed her labs and she will go to a [facility] to have her labs done.

## 2019-02-19 NOTE — Assessment & Plan Note (Signed)
Very pleasant 33 year old female with complaints of weight loss, poor appetite, nausea, early satiety.  She states she continues to lose weight gradually, has not been able to get it under control.  In 2017 she states she weighed 115 pounds.  She reports having her thyroid labs done.  She has had additional labs which we have requested.  She has anxiety and depression which she is working on managing and may be playing a role.  I will obtain labs for review.  Screening for celiac has not been done.  Offered upper endoscopy to evaluate nausea and early satiety.  Plan for deep sedation given polypharmacy.  I have discussed the risks, alternatives, benefits with regards to but not limited to the risk of reaction to medication, bleeding, infection, perforation and the patient is agreeable to proceed. Written consent to be obtained.  We will add PPI, pantoprazole 40 mg daily before breakfast for possibility of variant of GERD, gastritis or peptic ulcer disease.  If upper endoscopy is unremarkable, she may require CT imaging for unexplained weight loss.  Further recommendations to follow.  She has been advised to notify us if she has more than 2-3 more pound weight loss, change in symptoms, questions or concerns.

## 2019-02-19 NOTE — Progress Notes (Signed)
Primary Care Physician:  Benita StabileHall, John Z, MD  Primary Gastroenterologist:  Roetta SessionsMichael Rourk, MD   Chief Complaint  Patient presents with  . Weight Loss    115lbs 3 yrs ago and has been losing weight since  . Anorexia  . Nausea    "spurts" 4-5 times in a day, eating makes worse    HPI:  Stacy Rogers is a 33 y.o. female here at the request of Etheleen Siaourtney Keates NP with Dr. Margo AyeHall for further evaluation weight loss, nausea, poor appetite.  Patient states that over the past 3 years she has been gradually losing weight.  She has dropped from 115 pounds down to 89 pounds.  She has associated early satiety, nausea, poor appetite.  When she tries to eat she feels nauseated.  She can only eat a small portion of the time.  She has no vomiting.  She has been trying to be consistent with her intake to try to gain weight.  She is concerned about how sick she looks.  She has a lot of issues with anxiety depression has been working to get those under control.  Has had some recent medication changes.  Patient was switched from Zoloft to Effexor after [facility] testing.  She takes lorazepam for panic attacks and trazodone for sleep.  She denies any heartburn or abdominal pain.  She states she is always had a lot of nausea issues as long as she can remember.  At 1 point she was adding 3 boost today was not able to maintain her weight.  She also tried Ensure without any improvement.  24-hour dietary recall, 2 little [employer] biscuits and a banana for breakfast, deep dish personal pizza for lunch, applesauce and crackers for afternoon snack, meat into vegetables for dinner.  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  She has been at this job for 2-1/2 years.  She drinks plenty of liquids to keep her urine light yellow.  Denies dysphagia.  Bowel movements are regular.  No blood in stool or melena.  Chest x-ray back in September with hyperinflation of the lungs.  Emphysema or air  trapping from asthma can have this appearance.  She states she has had several labs done by PCP.  We will request records.       Current Outpatient Medications  Medication Sig Dispense Refill  . acyclovir (ZOVIRAX) 400 MG tablet Take 1 tablet (400 mg total) by mouth 3 (three) times daily as needed. 50 tablet 0  . albuterol (PROVENTIL HFA;VENTOLIN HFA) 108 (90 Base) MCG/ACT inhaler Inhale 2 puffs into the lungs 2 (two) times daily as needed for wheezing or shortness of breath. 8 g 2  . albuterol (PROVENTIL) (2.5 MG/3ML) 0.083% nebulizer solution Take 2.5 mg by nebulization every 6 (six) hours as needed for wheezing or shortness of breath.    . beclomethasone (QVAR REDIHALER) 40 MCG/ACT inhaler Inhale 2 puffs into the lungs 2 (two) times daily.    Marland Kitchen. LORazepam (ATIVAN) 1 MG tablet Take 1 mg by mouth every 6 (six) hours as needed for anxiety.    . montelukast (SINGULAIR) 10 MG tablet Take 1 tablet (10 mg total) by mouth at bedtime. 90 tablet 1  . traZODone (DESYREL) 50 MG tablet Take 50 mg by mouth at bedtime.    Marland Kitchen. venlafaxine XR (EFFEXOR-XR) 37.5 MG 24 hr capsule Take 37.5 mg by mouth daily.    Marland Kitchen. escitalopram (LEXAPRO) 20 MG tablet Take 1 tablet (20  mg total) by mouth daily. (Patient not taking: Reported on 05/31/2018) 30 tablet 5   No current facility-administered medications for this visit.     Allergies as of 02/19/2019 - Review Complete 02/19/2019  Allergen Reaction Noted  . Aspirin Swelling 02/26/2013  . Aspirin Swelling 05/21/2017  . Tramadol Nausea And Vomiting 05/31/2018    Past Medical History:  Diagnosis Date  . Anxiety   . Asthma   . Depression     Past Surgical History:  Procedure Laterality Date  . NECK SURGERY  05/2018    Family History  Problem Relation Age of Onset  . COPD Mother   . Alcohol abuse Father   . Colon cancer Neg Hx     Social History   Socioeconomic History  . Marital status: Married    Spouse name: Not on file  . Number of children: Not on  file  . Years of education: Not on file  . Highest education level: Not on file  Occupational History  . Not on file  Social Needs  . Financial resource strain: Not on file  . Food insecurity    Worry: Not on file    Inability: Not on file  . Transportation needs    Medical: Not on file    Non-medical: Not on file  Tobacco Use  . Smoking status: Current Every Day Smoker  . Smokeless tobacco: Never Used  Substance and Sexual Activity  . Alcohol use: No  . Drug use: No  . Sexual activity: Yes  Lifestyle  . Physical activity    Days per week: Not on file    Minutes per session: Not on file  . Stress: Not on file  Relationships  . Social Musician on phone: Not on file    Gets together: Not on file    Attends religious service: Not on file    Active member of club or organization: Not on file    Attends meetings of clubs or organizations: Not on file    Relationship status: Not on file  . Intimate partner violence    Fear of current or ex partner: Not on file    Emotionally abused: Not on file    Physically abused: Not on file    Forced sexual activity: Not on file  Other Topics Concern  . Not on file  Social History Narrative   ** Merged History Encounter **          ROS:  General: Negative for fever, chills, fatigue, weakness.  See HPI Eyes: Negative for vision changes.  ENT: Negative for hoarseness, difficulty swallowing , nasal congestion. CV: Negative for chest pain, angina, palpitations, dyspnea on exertion, peripheral edema.  Respiratory: Negative for dyspnea at rest, dyspnea on exertion, cough, sputum, wheezing.  GI: See history of present illness. GU:  Negative for dysuria, hematuria, urinary incontinence, urinary frequency, nocturnal urination.  MS: Negative for joint pain, low back pain.  Derm: Negative for rash or itching.  Neuro: Negative for weakness, abnormal sensation, seizure, frequent headaches, memory loss, confusion.  Psych:  Negative for anxiety, depression, suicidal ideation, hallucinations.  Endo: See HPI Heme: Negative for bruising or bleeding. Allergy: Negative for rash or hives.    Physical Examination:  BP 96/68   Pulse 70   Temp (!) 97 F (36.1 C) (Oral)   Ht 5\' 4"  (1.626 m)   Wt 91 lb 9.6 oz (41.5 kg)   LMP 01/17/2019   BMI 15.72 kg/m  General: Well-nourished, well-developed in no acute distress.  Head: Normocephalic, atraumatic.   Eyes: Conjunctiva pink, no icterus. Mouth: Oropharyngeal mucosa moist and pink , no lesions erythema or exudate. Neck: Supple without thyromegaly, masses, or lymphadenopathy.  Lungs: Clear to auscultation bilaterally.  Heart: Regular rate and rhythm, no murmurs rubs or gallops.  Abdomen: Bowel sounds are normal, nontender, nondistended, no hepatosplenomegaly or masses, no abdominal bruits or    hernia , no rebound or guarding.   Rectal: Not performed  extremities: No lower extremity edema. No clubbing or deformities.  Neuro: Alert and oriented x 4 , grossly normal neurologically.  Skin: Warm and dry, no rash or jaundice.   Psych: Alert and cooperative, normal mood and affect.  Labs: Requested  Imaging Studies: No results found.

## 2019-02-21 ENCOUNTER — Other Ambulatory Visit: Payer: Self-pay

## 2019-02-21 DIAGNOSIS — Z20822 Contact with and (suspected) exposure to covid-19: Secondary | ICD-10-CM

## 2019-02-22 LAB — NOVEL CORONAVIRUS, NAA: SARS-CoV-2, NAA: NOT DETECTED

## 2019-02-26 ENCOUNTER — Telehealth: Payer: Self-pay

## 2019-02-26 ENCOUNTER — Telehealth: Payer: Self-pay | Admitting: Internal Medicine

## 2019-02-26 NOTE — Telephone Encounter (Signed)
Pt was returning MB call from this morning. She is at work and said her next break would be at 0930 if MB could call her back then. [phone]

## 2019-02-26 NOTE — Telephone Encounter (Signed)
Called patient. She has moved procedure appt up to 12/11 at 8:00am. Patient aware will mail new instructions with new pre-op/covid appt, called endo and LMOVM making aware of change.

## 2019-02-26 NOTE — Telephone Encounter (Signed)
Called pt, she is working today. Unable to do procedure tomorrow as she would need COVID test by 12:00pm today. Will keep her on cancellation list.

## 2019-02-26 NOTE — Telephone Encounter (Signed)
See other phone note already started for today.

## 2019-02-26 NOTE — Telephone Encounter (Signed)
Pt on cancellation list to move up EGD w/Prop w/RMR if possible. Opening on schedule for tomorrow. Tried to call pt, no answer, LMOVM for return call.

## 2019-03-09 ENCOUNTER — Other Ambulatory Visit: Payer: Self-pay

## 2019-03-09 ENCOUNTER — Ambulatory Visit
Admission: RE | Admit: 2019-03-09 | Discharge: 2019-03-09 | Disposition: A | Discharge status: Home / Self Care | Payer: BC Managed Care – PPO | Source: Ambulatory Visit | Attending: Physician Assistant | Admitting: Physician Assistant

## 2019-03-09 DIAGNOSIS — M5412 Radiculopathy, cervical region: Secondary | ICD-10-CM

## 2019-03-09 MED ORDER — GADOBENATE DIMEGLUMINE 529 MG/ML IV SOLN
8.0000 mL | Freq: Once | INTRAVENOUS | Status: AC | PRN
  Administered 2019-03-09: 8 mL via INTRAVENOUS

## 2019-03-17 NOTE — Patient Instructions (Signed)
Stacy Rogers  03/17/2019     @PREFPERIOPPHARMACY @   Your procedure is scheduled on  03/21/2019.  Report to [facility] at  [phone]  A.M.  Call this number if you have problems the morning of surgery:  [phone]   Remember:  Follow the diet instructions given to you by Dr Roseanne Kaufman office.                     Take these medicines the morning of surgery with A SIP OF WATER  Diclofenac(if needed), lorazepam(if needed), effexor.    Do not wear jewelry, make-up or nail polish.  Do not wear lotions, powders, or perfumes. Please wear deodorant and brush your teeth.  Do not shave 48 hours prior to surgery.  Men may shave face and neck.  Do not bring valuables to the hospital.  [facility] is not responsible for any belongings or valuables.  Contacts, dentures or bridgework may not be worn into surgery.  Leave your suitcase in the car.  After surgery it may be brought to your room.  For patients admitted to the hospital, discharge time will be determined by your treatment team.  Patients discharged the day of surgery will not be allowed to drive home.   Name and phone number of your driver:   family Special instructions:  None  Please read over the following fact sheets that you were given. Anesthesia Post-op Instructions and Care and Recovery After Surgery       Upper Endoscopy, Adult, Care After This sheet gives you information about how to care for yourself after your procedure. Your health care provider may also give you more specific instructions. If you have problems or questions, contact your health care provider. What can I expect after the procedure? After the procedure, it is common to have:  A sore throat.  Mild stomach pain or discomfort.  Bloating.  Nausea. Follow these instructions at home:   Follow instructions from your health care provider about what to eat or drink after your procedure.  Return to your normal activities as told by your health  care provider. Ask your health care provider what activities are safe for you.  Take over-the-counter and prescription medicines only as told by your health care provider.  Do not drive for 24 hours if you were given a sedative during your procedure.  Keep all follow-up visits as told by your health care provider. This is important. Contact a health care provider if you have:  A sore throat that lasts longer than one day.  Trouble swallowing. Get help right away if:  You vomit blood or your vomit looks like coffee grounds.  You have: ? A fever. ? Bloody, black, or tarry stools. ? A severe sore throat or you cannot swallow. ? Difficulty breathing. ? Severe pain in your chest or abdomen. Summary  After the procedure, it is common to have a sore throat, mild stomach discomfort, bloating, and nausea.  Do not drive for 24 hours if you were given a sedative during the procedure.  Follow instructions from your health care provider about what to eat or drink after your procedure.  Return to your normal activities as told by your health care provider. This information is not intended to replace advice given to you by your health care provider. Make sure you discuss any questions you have with your health care provider. Document Released: 09/26/2011 Document Revised: 09/18/2017 Document Reviewed: 08/27/2017 Elsevier Patient  Education  2020 [employer] Monitored Anesthesia Care, Care After These instructions provide you with information about caring for yourself after your procedure. Your health care provider may also give you more specific instructions. Your treatment has been planned according to current medical practices, but problems sometimes occur. Call your health care provider if you have any problems or questions after your procedure. What can I expect after the procedure? After your procedure, you may:  Feel sleepy for several hours.  Feel clumsy and have poor balance for  several hours.  Feel forgetful about what happened after the procedure.  Have poor judgment for several hours.  Feel nauseous or vomit.  Have a sore throat if you had a breathing tube during the procedure. Follow these instructions at home: For at least 24 hours after the procedure:      Have a responsible adult stay with you. It is important to have someone help care for you until you are awake and alert.  Rest as needed.  Do not: ? Participate in activities in which you could fall or become injured. ? Drive. ? Use heavy machinery. ? Drink alcohol. ? Take sleeping pills or medicines that cause drowsiness. ? Make important decisions or sign legal documents. ? Take care of children on your own. Eating and drinking  Follow the diet that is recommended by your health care provider.  If you vomit, drink water, juice, or soup when you can drink without vomiting.  Make sure you have little or no nausea before eating solid foods. General instructions  Take over-the-counter and prescription medicines only as told by your health care provider.  If you have sleep apnea, surgery and certain medicines can increase your risk for breathing problems. Follow instructions from your health care provider about wearing your sleep device: ? Anytime you are sleeping, including during daytime naps. ? While taking prescription pain medicines, sleeping medicines, or medicines that make you drowsy.  If you smoke, do not smoke without supervision.  Keep all follow-up visits as told by your health care provider. This is important. Contact a health care provider if:  You keep feeling nauseous or you keep vomiting.  You feel light-headed.  You develop a rash.  You have a fever. Get help right away if:  You have trouble breathing. Summary  For several hours after your procedure, you may feel sleepy and have poor judgment.  Have a responsible adult stay with you for at least 24 hours or  until you are awake and alert. This information is not intended to replace advice given to you by your health care provider. Make sure you discuss any questions you have with your health care provider. Document Released: 07/18/2015 Document Revised: 06/25/2017 Document Reviewed: 07/18/2015 Elsevier Patient Education  2020 [employer].

## 2019-03-19 ENCOUNTER — Encounter (HOSPITAL_COMMUNITY): Payer: Self-pay

## 2019-03-19 ENCOUNTER — Other Ambulatory Visit (HOSPITAL_COMMUNITY)
Admission: RE | Admit: 2019-03-19 | Discharge: 2019-03-19 | Disposition: A | Discharge status: Home / Self Care | Payer: BC Managed Care – PPO | Source: Ambulatory Visit | Attending: Internal Medicine | Admitting: Internal Medicine

## 2019-03-19 ENCOUNTER — Encounter (HOSPITAL_COMMUNITY)
Admission: RE | Admit: 2019-03-19 | Discharge: 2019-03-19 | Disposition: A | Discharge status: Home / Self Care | Payer: BC Managed Care – PPO | Source: Ambulatory Visit | Attending: Internal Medicine | Admitting: Internal Medicine

## 2019-03-19 ENCOUNTER — Other Ambulatory Visit: Payer: Self-pay

## 2019-03-19 DIAGNOSIS — Z01812 Encounter for preprocedural laboratory examination: Secondary | ICD-10-CM | POA: Diagnosis not present

## 2019-03-19 LAB — HCG, SERUM, QUALITATIVE: Preg, Serum: NEGATIVE

## 2019-03-19 LAB — SARS CORONAVIRUS 2 (TAT 6-24 HRS): SARS Coronavirus 2: NEGATIVE

## 2019-03-20 ENCOUNTER — Encounter (HOSPITAL_COMMUNITY): Payer: Self-pay | Admitting: Anesthesiology

## 2019-03-21 ENCOUNTER — Telehealth: Payer: Self-pay | Admitting: *Deleted

## 2019-03-21 NOTE — Telephone Encounter (Signed)
Tried to call pt, no answer, LMOVM for return call.  

## 2019-03-21 NOTE — Telephone Encounter (Signed)
Pamala Hurry called from [facility] and said that pt's procedure would need to be rescheduled.  She did not have transportation.

## 2019-03-21 NOTE — Telephone Encounter (Signed)
Pt called office, EGD w/Propofol w/RMR rescheduled to 06/19/19 at 8:00am. Endo scheduler informed. Pre-op and COVID test 06/17/19. Appt letter mailed with procedure instructions.

## 2019-04-01 ENCOUNTER — Other Ambulatory Visit: Payer: Self-pay

## 2019-04-01 ENCOUNTER — Ambulatory Visit (INDEPENDENT_AMBULATORY_CARE_PROVIDER_SITE_OTHER): Payer: BC Managed Care – PPO

## 2019-04-01 ENCOUNTER — Ambulatory Visit (INDEPENDENT_AMBULATORY_CARE_PROVIDER_SITE_OTHER): Payer: BC Managed Care – PPO | Admitting: Orthopaedic Surgery

## 2019-04-01 DIAGNOSIS — G8929 Other chronic pain: Secondary | ICD-10-CM | POA: Diagnosis not present

## 2019-04-01 DIAGNOSIS — M25512 Pain in left shoulder: Secondary | ICD-10-CM

## 2019-04-01 MED ORDER — DIAZEPAM 5 MG PO TABS: 5.0000 mg | ORAL_TABLET | Freq: Four times a day (QID) | ORAL | 0 refills | Status: DC | PRN

## 2019-04-01 MED ORDER — BUPIVACAINE HCL 0.5 % IJ SOLN
2.0000 mL | INTRAMUSCULAR | Status: AC | PRN
  Administered 2019-04-01: 2 mL

## 2019-04-01 MED ORDER — LIDOCAINE 4 % EX PTCH: 1.0000 | MEDICATED_PATCH | Freq: Two times a day (BID) | CUTANEOUS | 6 refills | Status: DC

## 2019-04-01 MED ORDER — LIDOCAINE HCL 1 % IJ SOLN
2.0000 mL | INTRAMUSCULAR | Status: AC | PRN
  Administered 2019-04-01: 2 mL

## 2019-04-01 MED ORDER — METHYLPREDNISOLONE ACETATE 40 MG/ML IJ SUSP
40.0000 mg | INTRAMUSCULAR | Status: AC | PRN
  Administered 2019-04-01: 40 mg via INTRAMUSCULAR
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
## 2019-04-15 ENCOUNTER — Other Ambulatory Visit (HOSPITAL_COMMUNITY): Payer: BC Managed Care – PPO

## 2019-04-29 ENCOUNTER — Emergency Department (HOSPITAL_COMMUNITY): Payer: BC Managed Care – PPO | Attending: Emergency Medicine

## 2019-04-29 ENCOUNTER — Emergency Department (HOSPITAL_COMMUNITY)
Admission: EM | Admit: 2019-04-29 | Discharge: 2019-04-29 | Disposition: A | Discharge status: Home / Self Care | Attending: Emergency Medicine | Admitting: Emergency Medicine

## 2019-04-29 ENCOUNTER — Encounter (HOSPITAL_COMMUNITY): Payer: Self-pay | Admitting: Emergency Medicine

## 2019-04-29 ENCOUNTER — Other Ambulatory Visit: Payer: Self-pay

## 2019-04-29 DIAGNOSIS — S99919A Unspecified injury of unspecified ankle, initial encounter: Secondary | ICD-10-CM

## 2019-04-29 DIAGNOSIS — Y990 Civilian activity done for income or pay: Secondary | ICD-10-CM | POA: Insufficient documentation

## 2019-04-29 DIAGNOSIS — Y9389 Activity, other specified: Secondary | ICD-10-CM | POA: Diagnosis not present

## 2019-04-29 DIAGNOSIS — W228XXA Striking against or struck by other objects, initial encounter: Secondary | ICD-10-CM | POA: Insufficient documentation

## 2019-04-29 DIAGNOSIS — F17210 Nicotine dependence, cigarettes, uncomplicated: Secondary | ICD-10-CM | POA: Insufficient documentation

## 2019-04-29 DIAGNOSIS — J45909 Unspecified asthma, uncomplicated: Secondary | ICD-10-CM | POA: Diagnosis not present

## 2019-04-29 DIAGNOSIS — Z79899 Other long term (current) drug therapy: Secondary | ICD-10-CM | POA: Diagnosis not present

## 2019-04-29 DIAGNOSIS — S99912A Unspecified injury of left ankle, initial encounter: Secondary | ICD-10-CM | POA: Insufficient documentation

## 2019-04-29 DIAGNOSIS — Y9259 Other trade areas as the place of occurrence of the external cause: Secondary | ICD-10-CM | POA: Diagnosis not present

## 2019-04-29 MED ORDER — HYDROCODONE-ACETAMINOPHEN 5-325 MG PO TABS: ORAL_TABLET | ORAL | 0 refills | Status: DC

## 2019-04-29 NOTE — Discharge Instructions (Signed)
Continue to clean with mild soap and water.  Elevate your leg when possible.  Use the crutches for weightbearing.  Keep the area bandaged.  Call Dr. Mort Sawyers office to arrange a follow-up appointment later this week or next week.  Return to the ER for any signs of infection such as increasing pain, swelling, redness or drainage.

## 2019-04-29 NOTE — ED Triage Notes (Signed)
Had object hit her LT ankle at work yesterday, sent to urgent care and received 8 sutures.  Pt reports pain shooting up her leg since the incident.  Did not receive an xray at urgent care.

## 2019-04-30 ENCOUNTER — Telehealth: Payer: Self-pay | Admitting: Orthopedic Surgery

## 2019-04-30 NOTE — Telephone Encounter (Signed)
Stacy Rogers called this morning stating she wanted to make an appointment for an ankle injury that she suffered at work.  She said she went to Urgent Care on Monday the 18th.  She then went to the ED yesterday, the 19th due to pain in her ankle.  She said she wanted to make an appointment.  I asked her if she had spoken to her human resources as to setting up an appointment here in our office.  She said she had not.  I told her that we would have to get approval from them in order to schedule an appointment if she wanted to proceed with workman comp.  I asked her to speak to the human resources at her work and have them call us back.  I told her that we would have to speak to them in order to get the [facility] information.  She understood and she said she would do.

## 2019-04-30 NOTE — ED Provider Notes (Signed)
[facility] EMERGENCY DEPARTMENT Provider Note   CSN: [id number] Arrival date & time: 04/29/19  1531     History Chief Complaint  Patient presents with  . Leg Pain    Stacy Rogers is a 34 y.o. female.  HPI      Stacy Rogers is a 34 y.o. female who presents to the Emergency Department requesting evaluation  of continued pain to her left ankle secondary to a direct blow to the back of her ankle/lower leg.  She describes a work related injury in which she struck her ankle on the back of a metal cart causing a laceration to her ankle.  She was seen at urgent care yesterday when the incident occurred and had 8 sutures placed to the back of her ankle.  She complains of pain to the site and difficulty walking or flexing/extending her ankle.  She also describes a radiating pain to the posterior lower leg.  She denies numbness, swelling and drainage.    Past Medical History:  Diagnosis Date  . Anxiety   . Asthma   . Depression     Patient Active Problem List   Diagnosis Date Noted  . Loss of weight 02/19/2019  . Nausea without vomiting 02/19/2019  . Early satiety 02/19/2019  . Poor appetite 02/19/2019  . Cervical disc disorder with radiculopathy of cervical region 05/30/2018  . Cervical radiculopathy 05/30/2018  . Radiculopathy, cervical region 08/07/2017  . Cervicalgia 06/19/2017  . Depressed 12/28/2016  . Asthma 10/27/2016  . Current smoker 10/27/2016  . Oral herpes 10/27/2016  . GAD (generalized anxiety disorder) 10/27/2016  . Underweight 10/27/2016    Past Surgical History:  Procedure Laterality Date  . NECK SURGERY  05/2018     OB History   No obstetric history on file.     Family History  Problem Relation Age of Onset  . COPD Mother   . Alcohol abuse Father   . Colon cancer Neg Hx     Social History   Tobacco Use  . Smoking status: Current Every Day Smoker    Packs/day: 0.50    Years: 18.00    Pack years: 9.00    Types: Cigarettes  .  Smokeless tobacco: Never Used  Substance Use Topics  . Alcohol use: No  . Drug use: No    Home Medications Prior to Admission medications   Medication Sig Start Date End Date Taking? Authorizing Provider  albuterol (PROVENTIL HFA;VENTOLIN HFA) 108 (90 Base) MCG/ACT inhaler Inhale 2 puffs into the lungs 2 (two) times daily as needed for wheezing or shortness of breath. 10/27/16   Jannifer Rodney A, FNP  albuterol (PROVENTIL) (2.5 MG/3ML) 0.083% nebulizer solution Take 2.5 mg by nebulization every 6 (six) hours as needed for wheezing or shortness of breath.    [provider]  beclomethasone (QVAR REDIHALER) 40 MCG/ACT inhaler Inhale 2 puffs into the lungs 2 (two) times daily.    [provider]  diazepam (VALIUM) 5 MG tablet Take 1-2 tablets (5-10 mg total) by mouth every 6 (six) hours as needed for muscle spasms. 04/01/19   Tarry Kos, MD  diclofenac (VOLTAREN) 75 MG EC tablet Take 75 mg by mouth 2 (two) times daily as needed (inflammation/pain.).    [provider]  HYDROcodone-acetaminophen (NORCO/VICODIN) 5-325 MG tablet Take one tab po q 4 hrs prn pain 04/29/19   Josslin Sanjuan, PA-C  Lidocaine (HM LIDOCAINE PATCH) 4 % PTCH Apply 1 patch topically every 12 (twelve) hours. 04/01/19   Roda Shutters,  Marylynn Pearson, MD  LORazepam (ATIVAN) 1 MG tablet Take 1 mg by mouth daily as needed for anxiety.     [provider]  methocarbamol (ROBAXIN) 500 MG tablet Take 500 mg by mouth every 8 (eight) hours as needed for spasms. 01/20/19   [provider]  montelukast (SINGULAIR) 10 MG tablet Take 1 tablet (10 mg total) by mouth at bedtime. Patient taking differently: Take 10 mg by mouth daily as needed (allergies.).  10/27/16   Sharion Balloon, FNP  pantoprazole (PROTONIX) 40 MG tablet Take 1 tablet (40 mg total) by mouth daily before breakfast. Patient not taking: Reported on 03/10/2019 02/19/19   Mahala Menghini, PA-C  traZODone (DESYREL) 50 MG tablet Take 50 mg by mouth  at bedtime.    [provider]  valACYclovir (VALTREX) 500 MG tablet Take 500 mg by mouth 2 (two) times daily as needed. 01/03/19   [provider]  venlafaxine XR (EFFEXOR-XR) 37.5 MG 24 hr capsule Take 37.5 mg by mouth daily. 02/07/19   [provider]    Allergies    Aspirin, Aspirin, and Tramadol  Review of Systems   Review of Systems  Constitutional: Negative for chills and fever.  Respiratory: Negative for shortness of breath.   Cardiovascular: Negative for chest pain.  Musculoskeletal: Positive for arthralgias (left ankle pain) and myalgias. Negative for joint swelling.  Skin: Positive for wound.       Laceration   Neurological: Negative for weakness and numbness.  Hematological: Does not bruise/bleed easily.    Physical Exam Updated Vital Signs BP 104/70 (BP Location: Left Arm)   Pulse 74   Temp 98.5 F (36.9 C) (Oral)   Resp 16   Ht 5\' 4"  (1.626 m)   Wt 42.6 kg   LMP 04/24/2019   SpO2 96%   BMI 16.14 kg/m   Physical Exam Vitals and nursing note reviewed.  Constitutional:      General: She is not in acute distress.    Appearance: Normal appearance. She is well-developed.  HENT:     Head: Atraumatic.  Cardiovascular:     Rate and Rhythm: Normal rate and regular rhythm.     Pulses: Normal pulses.  Pulmonary:     Effort: Pulmonary effort is normal. No respiratory distress.     Breath sounds: Normal breath sounds.  Musculoskeletal:     Left ankle: Laceration present. No swelling. Tenderness present. Decreased range of motion.     Left Achilles Tendon: Tenderness present. No defects.     Comments: Laceration to the posterior left ankle with sutures in place.  Mild ecchymosis noted.  There is ttp along the Achilles tendon without definite defect.  [place] Silos test is equivocal due to pateint level of pain.    Skin:    General: Skin is warm.     Capillary Refill: Capillary refill takes less than 2 seconds.     Findings: Laceration present.   Neurological:     General: No focal deficit present.     Mental Status: She is alert.     Sensory: No sensory deficit.     Motor: No weakness or abnormal muscle tone.     Coordination: Coordination normal.     ED Results / Procedures / Treatments   Labs (all labs ordered are listed, but only abnormal results are displayed) Labs Reviewed - No data to display  EKG None  Radiology DG Ankle Complete Left  Result Date: 04/29/2019 CLINICAL DATA:  Left ankle pain for  1 day EXAM: LEFT ANKLE COMPLETE - 3+ VIEW COMPARISON:  None. FINDINGS: There is no evidence of fracture, dislocation, or joint effusion. There is no evidence of arthropathy or other focal bone abnormality. Soft tissues are unremarkable. IMPRESSION: Negative. Electronically Signed   By: Duanne Guess D.O.   On: 04/29/2019 16:53    Procedures Procedures (including critical care time)  Medications Ordered in ED Medications - No data to display  ED Course  I have reviewed the triage vital signs and the nursing notes.  Pertinent labs & imaging results that were available during my care of the patient were reviewed by me and considered in my medical decision making (see chart for details).    MDM Rules/Calculators/A&P                      Patient with injury to the posterior left ankle.  She was seen at a local urgent care initially and had sutured repair of a laceration.  Wound appears to be healing well.  No obvious signs of infection at this time.  Clinical exam is concerning for possible injury of the Achilles tendon.  Patient is neurovascularly intact and compartments are soft.  Wound was cleaned and bandaged and a posterior splint was applied and patient was given crutches.  She agrees to close orthopedic follow-up.  She prefers to follow-up with local orthopedics.  Return precautions were also discussed.  l Clinical Impression(s) / ED Diagnoses Final diagnoses:  Ankle injury, initial encounter    Rx / DC  Orders ED Discharge Orders         Ordered    HYDROcodone-acetaminophen (NORCO/VICODIN) 5-325 MG tablet     04/29/19 1735           Pauline Aus, PA-C 05/02/19 1250    Raeford Razor, MD 05/02/19 1511

## 2019-05-01 ENCOUNTER — Other Ambulatory Visit: Payer: Self-pay

## 2019-05-01 ENCOUNTER — Ambulatory Visit (INDEPENDENT_AMBULATORY_CARE_PROVIDER_SITE_OTHER): Admitting: Orthopaedic Surgery

## 2019-05-01 ENCOUNTER — Encounter: Payer: Self-pay | Admitting: Orthopaedic Surgery

## 2019-05-01 VITALS — BP 107/71 | HR 75 | Temp 97.4°F | Ht 64.0 in | Wt 94.0 lb

## 2019-05-01 DIAGNOSIS — S81812A Laceration without foreign body, left lower leg, initial encounter: Secondary | ICD-10-CM

## 2019-05-01 NOTE — Progress Notes (Signed)
Subjective:    Patient ID: Stacy Rogers, female    DOB: 14-Nov-1985, 34 y.o.   MRN: [id number]  HPI She was injured on the job while working for [employer] in [place] on 04-28-2019 around 4 pm in the afternoon.  She was putting tubes on a buggie. She turned it around ant the buggie caught the back of her ankle as she was turning it around and cut it.  She was seen at Urgent Care on 04-29-2019. X-rays were negative.  She was treated with sutures and given a posterior splint.  She was given crutches.  I have reviewed the Urgent Care notes, the X-rays and x-ray report. I have independently reviewed and interpreted x-rays of this patient done at another site by another physician or qualified health professional.  She has pain in the ankle on the left and cannot put it on the floor without pain.  She has no fever, no redness.  She has no other injury.   Review of Systems  Constitutional: Positive for activity change.  Musculoskeletal: Positive for gait problem and joint swelling.  Psychiatric/Behavioral: The patient is nervous/anxious.   All other systems reviewed and are negative.  For Review of Systems, all other systems reviewed and are negative.  The following is a summary of the past history medically, past history surgically, known current medicines, social history and family history.  This information is gathered electronically by the computer from prior information and documentation.  I review this each visit and have found including this information at this point in the chart is beneficial and informative.   Past Medical History:  Diagnosis Date  . Anxiety   . Asthma   . Depression     Past Surgical History:  Procedure Laterality Date  . NECK SURGERY  05/2018    Current Outpatient Medications on File Prior to Visit  Medication Sig Dispense Refill  . albuterol (PROVENTIL HFA;VENTOLIN HFA) 108 (90 Base) MCG/ACT inhaler Inhale 2 puffs into the lungs 2 (two) times daily as  needed for wheezing or shortness of breath. 8 g 2  . albuterol (PROVENTIL) (2.5 MG/3ML) 0.083% nebulizer solution Take 2.5 mg by nebulization every 6 (six) hours as needed for wheezing or shortness of breath.    . beclomethasone (QVAR REDIHALER) 40 MCG/ACT inhaler Inhale 2 puffs into the lungs 2 (two) times daily.    . diazepam (VALIUM) 5 MG tablet Take 1-2 tablets (5-10 mg total) by mouth every 6 (six) hours as needed for muscle spasms. 30 tablet 0  . diclofenac (VOLTAREN) 75 MG EC tablet Take 75 mg by mouth 2 (two) times daily as needed (inflammation/pain.).    Marland Kitchen HYDROcodone-acetaminophen (NORCO/VICODIN) 5-325 MG tablet Take one tab po q 4 hrs prn pain 8 tablet 0  . Lidocaine (HM LIDOCAINE PATCH) 4 % PTCH Apply 1 patch topically every 12 (twelve) hours. 10 patch 6  . LORazepam (ATIVAN) 1 MG tablet Take 1 mg by mouth daily as needed for anxiety.     . methocarbamol (ROBAXIN) 500 MG tablet Take 500 mg by mouth every 8 (eight) hours as needed for spasms.    . montelukast (SINGULAIR) 10 MG tablet Take 1 tablet (10 mg total) by mouth at bedtime. (Patient taking differently: Take 10 mg by mouth daily as needed (allergies.). ) 90 tablet 1  . pantoprazole (PROTONIX) 40 MG tablet Take 1 tablet (40 mg total) by mouth daily before breakfast. 90 tablet 1  . traZODone (DESYREL) 50 MG tablet Take 50 mg  by mouth at bedtime.    . valACYclovir (VALTREX) 500 MG tablet Take 500 mg by mouth 2 (two) times daily as needed.    . venlafaxine XR (EFFEXOR-XR) 37.5 MG 24 hr capsule Take 37.5 mg by mouth daily.     No current facility-administered medications on file prior to visit.    Social History   Socioeconomic History  . Marital status: Married    Spouse name: Not on file  . Number of children: Not on file  . Years of education: Not on file  . Highest education level: Not on file  Occupational History  . Not on file  Tobacco Use  . Smoking status: Current Every Day Smoker    Packs/day: 0.50    Years:  18.00    Pack years: 9.00    Types: Cigarettes  . Smokeless tobacco: Never Used  Substance and Sexual Activity  . Alcohol use: No  . Drug use: No  . Sexual activity: Yes  Other Topics Concern  . Not on file  Social History Narrative   ** Merged History Encounter **       Social Determinants of Health   Financial Resource Strain:   . Difficulty of Paying Living Expenses: Not on file  Food Insecurity:   . Worried About Charity fundraiser in the Last Year: Not on file  . Ran Out of Food in the Last Year: Not on file  Transportation Needs:   . Lack of Transportation (Medical): Not on file  . Lack of Transportation (Non-Medical): Not on file  Physical Activity:   . Days of Exercise per Week: Not on file  . Minutes of Exercise per Session: Not on file  Stress:   . Feeling of Stress : Not on file  Social Connections:   . Frequency of Communication with Friends and Family: Not on file  . Frequency of Social Gatherings with Friends and Family: Not on file  . Attends Religious Services: Not on file  . Active Member of Clubs or Organizations: Not on file  . Attends Archivist Meetings: Not on file  . Marital Status: Not on file  Intimate Partner Violence:   . Fear of Current or Ex-Partner: Not on file  . Emotionally Abused: Not on file  . Physically Abused: Not on file  . Sexually Abused: Not on file    Family History  Problem Relation Age of Onset  . COPD Mother   . Alcohol abuse Father   . Colon cancer Neg Hx     BP 107/71   Pulse 75   Temp (!) 97.4 F (36.3 C)   Ht 5\' 4"  (1.626 m)   Wt 94 lb (42.6 kg)   LMP 04/24/2019   BMI 16.14 kg/m   Body mass index is 16.14 kg/m.      Objective:   Physical Exam Vitals and nursing note reviewed.  Constitutional:      Appearance: She is well-developed.  HENT:     Head: Normocephalic and atraumatic.  Eyes:     Conjunctiva/sclera: Conjunctivae normal.     Pupils: Pupils are equal, round, and reactive to  light.  Cardiovascular:     Rate and Rhythm: Normal rate and regular rhythm.  Pulmonary:     Effort: Pulmonary effort is normal.  Abdominal:     Palpations: Abdomen is soft.  Musculoskeletal:     Cervical back: Normal range of motion and neck supple.       Legs:  Skin:  General: Skin is warm and dry.  Neurological:     Mental Status: She is alert and oriented to person, place, and time.     Cranial Nerves: No cranial nerve deficit.     Motor: No abnormal muscle tone.     Coordination: Coordination normal.     Deep Tendon Reflexes: Reflexes are normal and symmetric. Reflexes normal.  Psychiatric:        Behavior: Behavior normal.        Thought Content: Thought content normal.        Judgment: Judgment normal.           Assessment & Plan:   Encounter Diagnosis  Name Primary?  . Laceration of left lower leg, initial encounter Yes   She is given new dressing.  She is given CAM walker.  She is to continue crutches and then attempt weight bearing as tolerated.  Stay out of work.  Return in one week.  Remove sutures then.  Call if any problem.  Precautions discussed.   Electronically Signed Darreld Mclean, MD 1/21/20219:51 AM

## 2019-05-01 NOTE — Patient Instructions (Signed)
OUT OF WORK ?

## 2019-05-02 ENCOUNTER — Other Ambulatory Visit: Payer: Self-pay | Admitting: Orthopaedic Surgery

## 2019-05-08 ENCOUNTER — Telehealth: Payer: Self-pay | Admitting: Orthopaedic Surgery

## 2019-05-08 ENCOUNTER — Encounter: Payer: Self-pay | Admitting: Orthopaedic Surgery

## 2019-05-08 ENCOUNTER — Other Ambulatory Visit: Payer: Self-pay | Admitting: Orthopaedic Surgery

## 2019-05-08 ENCOUNTER — Ambulatory Visit (INDEPENDENT_AMBULATORY_CARE_PROVIDER_SITE_OTHER): Admitting: Orthopaedic Surgery

## 2019-05-08 ENCOUNTER — Other Ambulatory Visit: Payer: Self-pay

## 2019-05-08 VITALS — Ht 64.0 in | Wt 94.0 lb

## 2019-05-08 DIAGNOSIS — S81812D Laceration without foreign body, left lower leg, subsequent encounter: Secondary | ICD-10-CM

## 2019-05-08 DIAGNOSIS — S96912D Strain of unspecified muscle and tendon at ankle and foot level, left foot, subsequent encounter: Secondary | ICD-10-CM

## 2019-05-08 NOTE — Telephone Encounter (Signed)
Xu patient

## 2019-05-08 NOTE — Progress Notes (Signed)
Patient Stacy Rogers:EGBTDVVOH Garde, female DOB:1985/05/29, 34 y.o. YWV:[id number]  Chief Complaint  Patient presents with  . Ankle Injury    Left ankle DOI 04/28/19.    HPI  Stacy Rogers is a 34 y.o. female who had injury to the left lower leg.  She has sutures that need to be removed today.  She has had bruising.  She has been in CAM walker. She complains of pain and the bruising.  I will have her begin PT and weight bearing as tolerated.   Body mass index is 16.14 kg/m.  ROS  Review of Systems  Constitutional: Positive for activity change.  Musculoskeletal: Positive for gait problem and joint swelling.  Psychiatric/Behavioral: The patient is nervous/anxious.   All other systems reviewed and are negative.   All other systems reviewed and are negative.  The following is a summary of the past history medically, past history surgically, known current medicines, social history and family history.  This information is gathered electronically by the computer from prior information and documentation.  I review this each visit and have found including this information at this point in the chart is beneficial and informative.    Past Medical History:  Diagnosis Date  . Anxiety   . Asthma   . Depression     Past Surgical History:  Procedure Laterality Date  . NECK SURGERY  05/2018    Family History  Problem Relation Age of Onset  . COPD Mother   . Alcohol abuse Father   . Colon cancer Neg Hx     Social History Social History   Tobacco Use  . Smoking status: Current Every Day Smoker    Packs/day: 0.50    Years: 18.00    Pack years: 9.00    Types: Cigarettes  . Smokeless tobacco: Never Used  Substance Use Topics  . Alcohol use: No  . Drug use: No    Allergies  Allergen Reactions  . Aspirin Swelling    Only to the lips  . Aspirin Swelling    Blisters  . Tramadol Nausea And Vomiting    headache    Current Outpatient Medications  Medication Sig Dispense Refill   . albuterol (PROVENTIL HFA;VENTOLIN HFA) 108 (90 Base) MCG/ACT inhaler Inhale 2 puffs into the lungs 2 (two) times daily as needed for wheezing or shortness of breath. 8 g 2  . albuterol (PROVENTIL) (2.5 MG/3ML) 0.083% nebulizer solution Take 2.5 mg by nebulization every 6 (six) hours as needed for wheezing or shortness of breath.    . beclomethasone (QVAR REDIHALER) 40 MCG/ACT inhaler Inhale 2 puffs into the lungs 2 (two) times daily.    . diazepam (VALIUM) 5 MG tablet Take 1-2 tablets (5-10 mg total) by mouth every 6 (six) hours as needed for muscle spasms. 30 tablet 0  . diclofenac (VOLTAREN) 75 MG EC tablet Take 75 mg by mouth 2 (two) times daily as needed (inflammation/pain.).    Marland Kitchen HYDROcodone-acetaminophen (NORCO/VICODIN) 5-325 MG tablet Take one tab po q 4 hrs prn pain 8 tablet 0  . Lidocaine (HM LIDOCAINE PATCH) 4 % PTCH Apply 1 patch topically every 12 (twelve) hours. 10 patch 6  . LORazepam (ATIVAN) 1 MG tablet Take 1 mg by mouth daily as needed for anxiety.     . methocarbamol (ROBAXIN) 500 MG tablet Take 500 mg by mouth every 8 (eight) hours as needed for spasms.    . montelukast (SINGULAIR) 10 MG tablet Take 1 tablet (10 mg total) by mouth at bedtime. (Patient  taking differently: Take 10 mg by mouth daily as needed (allergies.). ) 90 tablet 1  . pantoprazole (PROTONIX) 40 MG tablet Take 1 tablet (40 mg total) by mouth daily before breakfast. 90 tablet 1  . traZODone (DESYREL) 50 MG tablet Take 50 mg by mouth at bedtime.    . valACYclovir (VALTREX) 500 MG tablet Take 500 mg by mouth 2 (two) times daily as needed.    . venlafaxine XR (EFFEXOR-XR) 37.5 MG 24 hr capsule Take 37.5 mg by mouth daily.     No current facility-administered medications for this visit.     Physical Exam  Height 5\' 4"  (1.626 m), weight 94 lb (42.6 kg), last menstrual period 04/24/2019.  Constitutional: overall normal hygiene, normal nutrition, well developed, normal grooming, normal body habitus. Assistive  device:CAM walker left  Musculoskeletal: gait and station Limp left, muscle tone and strength are normal, no tremors or atrophy is present.  .  Neurological: coordination overall normal.  Deep tendon reflex/nerve stretch intact.  Sensation normal.  Cranial nerves II-XII intact.   Skin:   Normal overall no scars, lesions, ulcers or rashes. No psoriasis.  Psychiatric: Alert and oriented x 3.  Recent memory intact, remote memory unclear.  Normal mood and affect. Well groomed.  Good eye contact.  Cardiovascular: overall no swelling, no varicosities, no edema bilaterally, normal temperatures of the legs and arms, no clubbing, cyanosis and good capillary refill.  Lymphatic: palpation is normal.  Laceration of the left lower leg more medially looks good  Sutures removed.  She has ecchymosis of the left ankle resolving. She complains of pain.  All other systems reviewed and are negative   The patient has been educated about the nature of the problem(s) and counseled on treatment options.  The patient appeared to understand what I have discussed and is in agreement with it.  Encounter Diagnoses  Name Primary?  . Laceration of left lower leg, subsequent encounter Yes  . Strain of left ankle, subsequent encounter     PLAN Call if any problems.  Precautions discussed.  Continue current medications.   Return to clinic 1 week   Begin PT.  Weight bear as tolerated.  Stay out of work.  Electronically Signed Sanjuana Kava, MD 1/28/202110:26 AM

## 2019-05-08 NOTE — Patient Instructions (Addendum)
Weight bear as tolerated try to walk on it when you can. Start physical therapy when approved by Workers compensation   Stay out of work

## 2019-05-08 NOTE — Telephone Encounter (Signed)
As per notes in referral workqueue, contacted [employer] (ph#  [phone] fax# [phone]) worker's comp for patient's employer, [employer]. Ref Claim# [id number]. Claim handler: Arloa Koh, direct [phone]. Notes,physical therapy orders faxed. Patient aware.  Refer to workqueue.

## 2019-05-09 NOTE — Telephone Encounter (Signed)
I just refused this.  I can call in robaxin but not this

## 2019-05-13 ENCOUNTER — Telehealth: Payer: Self-pay | Admitting: Orthopaedic Surgery

## 2019-05-13 NOTE — Telephone Encounter (Signed)
I called patient to notify of status regarding worker's comp physical therapy per call from Zella Ball at [employer], ph# [phone], relaying that she is approving physical therapy - location and further details to follow (I have updated the referral in workqueue). Patient then mentioned she is having tingling, as though her leg is about to fall asleep, and also some calf pain. Offered appointment for this afternoon; patient said she thinks she'll be fine to wait till her scheduled appointment Thursday, 05/15/19.  Please advise.

## 2019-05-14 ENCOUNTER — Other Ambulatory Visit: Payer: Self-pay | Admitting: Orthopedic Surgery

## 2019-05-14 DIAGNOSIS — S81812D Laceration without foreign body, left lower leg, subsequent encounter: Secondary | ICD-10-CM

## 2019-05-15 ENCOUNTER — Encounter: Payer: Self-pay | Admitting: Orthopaedic Surgery

## 2019-05-15 ENCOUNTER — Other Ambulatory Visit: Payer: Self-pay

## 2019-05-15 ENCOUNTER — Ambulatory Visit (INDEPENDENT_AMBULATORY_CARE_PROVIDER_SITE_OTHER): Admitting: Orthopaedic Surgery

## 2019-05-15 VITALS — BP 103/68 | HR 84 | Temp 99.1°F | Ht 64.0 in | Wt 95.2 lb

## 2019-05-15 DIAGNOSIS — S96912D Strain of unspecified muscle and tendon at ankle and foot level, left foot, subsequent encounter: Secondary | ICD-10-CM

## 2019-05-15 DIAGNOSIS — S81812D Laceration without foreign body, left lower leg, subsequent encounter: Secondary | ICD-10-CM

## 2019-05-15 NOTE — Patient Instructions (Addendum)
Use Lotion, or Aspercreme, Biofreeze or Voltaren gel over the counter 2-3 times daily make sure you rub it in well each time you use it.  Stretch you leg with a towel under your foot, pull your foot up with the towel to give a gentle stretch several times before you get out of bed  Smoking Tobacco Information, Adult Smoking tobacco can be harmful to your health. Tobacco contains a poisonous (toxic), colorless chemical called nicotine. Nicotine is addictive. It changes the brain and can make it hard to stop smoking. Tobacco also has other toxic chemicals that can hurt your body and raise your risk of many cancers. How can smoking tobacco affect me? Smoking tobacco puts you at risk for:  Cancer. Smoking is most commonly associated with lung cancer, but can also lead to cancer in other parts of the body.  Chronic obstructive pulmonary disease (COPD). This is a long-term lung condition that makes it hard to breathe. It also gets worse over time.  High blood pressure (hypertension), heart disease, stroke, or heart attack.  Lung infections, such as pneumonia.  Cataracts. This is when the lenses in the eyes become clouded.  Digestive problems. This may include peptic ulcers, heartburn, and gastroesophageal reflux disease (GERD).  Oral health problems, such as gum disease and tooth loss.  Loss of taste and smell. Smoking can affect your appearance by causing:  Wrinkles.  Yellow or stained teeth, fingers, and fingernails. Smoking tobacco can also affect your social life, because:  It may be challenging to find places to smoke when away from home. Many workplaces, [employer], hotels, and public places are tobacco-free.  Smoking is expensive. This is due to the cost of tobacco and the long-term costs of treating health problems from smoking.  Secondhand smoke may affect those around you. Secondhand smoke can cause lung cancer, breathing problems, and heart disease. Children of smokers have a  higher risk for: ? Sudden infant death syndrome (SIDS). ? Ear infections. ? Lung infections. If you currently smoke tobacco, quitting now can help you:  Lead a longer and healthier life.  Look, smell, breathe, and feel better over time.  Save money.  Protect others from the harms of secondhand smoke. What actions can I take to prevent health problems? Quit smoking   Do not start smoking. Quit if you already do.  Make a plan to quit smoking and commit to it. Look for programs to help you and ask your health care provider for recommendations and ideas.  Set a date and write down all the reasons you want to quit.  Let your friends and family know you are quitting so they can help and support you. Consider finding friends who also want to quit. It can be easier to quit with someone else, so that you can support each other.  Talk with your health care provider about using nicotine replacement medicines to help you quit, such as gum, lozenges, patches, sprays, or pills.  Do not replace cigarette smoking with electronic cigarettes, which are commonly called e-cigarettes. The safety of e-cigarettes is not known, and some may contain harmful chemicals.  If you try to quit but return to smoking, stay positive. It is common to slip up when you first quit, so take it one day at a time.  Be prepared for cravings. When you feel the urge to smoke, chew gum or suck on hard candy. Lifestyle  Stay busy and take care of your body.  Drink enough fluid to keep your  urine pale yellow.  Get plenty of exercise and eat a healthy diet. This can help prevent weight gain after quitting.  Monitor your eating habits. Quitting smoking can cause you to have a larger appetite than when you smoke.  Find ways to relax. Go out with friends or family to a movie or a restaurant where people do not smoke.  Ask your health care provider about having regular tests (screenings) to check for cancer. This may  include blood tests, imaging tests, and other tests.  Find ways to manage your stress, such as meditation, yoga, or exercise. Where to find support To get support to quit smoking, consider:  Asking your health care provider for more information and resources.  Taking classes to learn more about quitting smoking.  Looking for local organizations that offer resources about quitting smoking.  Joining a support group for people who want to quit smoking in your local community.  Calling the smokefree.gov counselor helpline: 1-800-Quit-Now (352)182-3069) Where to find more information You may find more information about quitting smoking from:  HelpGuide.org: www.helpguide.org  https://hall.com/: smokefree.gov  American Lung Association: www.lung.org Contact a health care provider if you:  Have problems breathing.  Notice that your lips, nose, or fingers turn blue.  Have chest pain.  Are coughing up blood.  Feel faint or you pass out.  Have other health changes that cause you to worry. Summary  Smoking tobacco can negatively affect your health, the health of those around you, your finances, and your social life.  Do not start smoking. Quit if you already do. If you need help quitting, ask your health care provider.  Think about joining a support group for people who want to quit smoking in your local community. There are many effective programs that will help you to quit this behavior. This information is not intended to replace advice given to you by your health care provider. Make sure you discuss any questions you have with your health care provider. Document Revised: 12/20/2018 Document Reviewed: 04/11/2016 Elsevier Patient Education  2020 [employer].

## 2019-05-15 NOTE — Progress Notes (Signed)
Patient Stacy Rogers, female DOB:08/22/1985, 34 y.o. [id number]  Chief Complaint  Patient presents with  . Leg Pain    L/its getting worse/stays swollen, burns/bruising on both sides    HPI  Stacy Rogers is a 34 y.o. female who has pain left lower leg post laceration and injury at work.  She has increased pain after sitting a while.  She has PT to begin tomorrow.  There was a slight delay in getting into PT.  She is waking with a limp and not using her crutches.  I have told her to use the crutches.  I have given her exercises to do at home.  PT will give her more.  I will keep her out of work and see her on 05-27-2019.   Body mass index is 16.35 kg/m.  ROS  Review of Systems  Constitutional: Positive for activity change.  Musculoskeletal: Positive for gait problem and joint swelling.  Psychiatric/Behavioral: The patient is nervous/anxious.   All other systems reviewed and are negative.   All other systems reviewed and are negative.  The following is a summary of the past history medically, past history surgically, known current medicines, social history and family history.  This information is gathered electronically by the computer from prior information and documentation.  I review this each visit and have found including this information at this point in the chart is beneficial and informative.    Past Medical History:  Diagnosis Date  . Anxiety   . Asthma   . Depression     Past Surgical History:  Procedure Laterality Date  . NECK SURGERY  05/2018    Family History  Problem Relation Age of Onset  . COPD Mother   . Alcohol abuse Father   . Colon cancer Neg Hx     Social History Social History   Tobacco Use  . Smoking status: Current Every Day Smoker    Packs/day: 0.50    Years: 18.00    Pack years: 9.00    Types: Cigarettes  . Smokeless tobacco: Never Used  Substance Use Topics  . Alcohol use: No  . Drug use: No    Allergies  Allergen  Reactions  . Aspirin Swelling    Only to the lips  . Aspirin Swelling    Blisters  . Tramadol Nausea And Vomiting    headache    Current Outpatient Medications  Medication Sig Dispense Refill  . albuterol (PROVENTIL HFA;VENTOLIN HFA) 108 (90 Base) MCG/ACT inhaler Inhale 2 puffs into the lungs 2 (two) times daily as needed for wheezing or shortness of breath. 8 g 2  . albuterol (PROVENTIL) (2.5 MG/3ML) 0.083% nebulizer solution Take 2.5 mg by nebulization every 6 (six) hours as needed for wheezing or shortness of breath.    . beclomethasone (QVAR REDIHALER) 40 MCG/ACT inhaler Inhale 2 puffs into the lungs 2 (two) times daily.    . diazepam (VALIUM) 5 MG tablet Take 1-2 tablets (5-10 mg total) by mouth every 6 (six) hours as needed for muscle spasms. 30 tablet 0  . diclofenac (VOLTAREN) 75 MG EC tablet Take 75 mg by mouth 2 (two) times daily as needed (inflammation/pain.).    Marland Kitchen HYDROcodone-acetaminophen (NORCO/VICODIN) 5-325 MG tablet Take one tab po q 4 hrs prn pain 8 tablet 0  . Lidocaine (HM LIDOCAINE PATCH) 4 % PTCH Apply 1 patch topically every 12 (twelve) hours. 10 patch 6  . LORazepam (ATIVAN) 1 MG tablet Take 1 mg by mouth daily as needed for anxiety.     Marland Kitchen  methocarbamol (ROBAXIN) 500 MG tablet Take 500 mg by mouth every 8 (eight) hours as needed for spasms.    . montelukast (SINGULAIR) 10 MG tablet Take 1 tablet (10 mg total) by mouth at bedtime. (Patient taking differently: Take 10 mg by mouth daily as needed (allergies.). ) 90 tablet 1  . pantoprazole (PROTONIX) 40 MG tablet Take 1 tablet (40 mg total) by mouth daily before breakfast. 90 tablet 1  . traZODone (DESYREL) 50 MG tablet Take 50 mg by mouth at bedtime.    . valACYclovir (VALTREX) 500 MG tablet Take 500 mg by mouth 2 (two) times daily as needed.    . venlafaxine XR (EFFEXOR-XR) 37.5 MG 24 hr capsule Take 37.5 mg by mouth daily.     No current facility-administered medications for this visit.     Physical Exam  Blood  pressure 103/68, pulse 84, temperature 99.1 F (37.3 C), height 5\' 4"  (1.626 m), weight 95 lb 4 oz (43.2 kg), last menstrual period 04/24/2019.  Constitutional: overall normal hygiene, normal nutrition, well developed, normal grooming, normal body habitus. Assistive device:none  Musculoskeletal: gait and station Limp left, muscle tone and strength are normal, no tremors or atrophy is present.  .  Neurological: coordination overall normal.  Deep tendon reflex/nerve stretch intact.  Sensation normal.  Cranial nerves II-XII intact.   Skin:   Normal overall no scars, lesions, ulcers or rashes. No psoriasis.  Psychiatric: Alert and oriented x 3.  Recent memory intact, remote memory unclear.  Normal mood and affect. Well groomed.  Good eye contact.  Cardiovascular: overall no swelling, no varicosities, no edema bilaterally, normal temperatures of the legs and arms, no clubbing, cyanosis and good capillary refill.  Lymphatic: palpation is normal.  Wound left lower leg is healing well.  No redness is present.  NV intact.  She has decided limp to the left.  NV intact.  ROM ankle limited secondary to pain. All other systems reviewed and are negative   The patient has been educated about the nature of the problem(s) and counseled on treatment options.  The patient appeared to understand what I have discussed and is in agreement with it.  Encounter Diagnoses  Name Primary?  . Laceration of left lower leg, subsequent encounter Yes  . Strain of left ankle, subsequent encounter     PLAN Call if any problems.  Precautions discussed.  Continue current medications.   Return to clinic 05-27-2019   Out of work.  Go to PT.  Electronically Signed 05-29-2019, MD 2/4/202110:17 AM

## 2019-05-27 ENCOUNTER — Other Ambulatory Visit: Payer: Self-pay

## 2019-05-27 ENCOUNTER — Encounter: Payer: Self-pay | Admitting: Orthopaedic Surgery

## 2019-05-27 ENCOUNTER — Ambulatory Visit (INDEPENDENT_AMBULATORY_CARE_PROVIDER_SITE_OTHER): Admitting: Orthopaedic Surgery

## 2019-05-27 VITALS — Ht 64.0 in | Wt 95.0 lb

## 2019-05-27 DIAGNOSIS — S96912D Strain of unspecified muscle and tendon at ankle and foot level, left foot, subsequent encounter: Secondary | ICD-10-CM

## 2019-05-27 DIAGNOSIS — M7662 Achilles tendinitis, left leg: Secondary | ICD-10-CM

## 2019-05-27 NOTE — Progress Notes (Signed)
Patient Stacy Rogers, female DOB:05-29-1985, 34 y.o. ERX:[id number]  Chief Complaint  Patient presents with  . Leg Injury    Left lower leg    HPI  Saraann Enneking is a 34 y.o. female who has increasing pain of the left lower leg and Achilles area.  She has been to PT and I have read the first note.  She took a miss step the other day and has had increasing pain of the left Achilles area.  She is out of the CAM walker and using a cane.  I told her to resume the CAM walker.  I have explained desensitizing the area.  I have told her how to do this.  She will continue PT.  Return in two weeks.  Stay out of work.   Body mass index is 16.31 kg/m.  ROS  Review of Systems  Constitutional: Positive for activity change.  Musculoskeletal: Positive for gait problem and joint swelling.  Psychiatric/Behavioral: The patient is nervous/anxious.   All other systems reviewed and are negative.   All other systems reviewed and are negative.  The following is a summary of the past history medically, past history surgically, known current medicines, social history and family history.  This information is gathered electronically by the computer from prior information and documentation.  I review this each visit and have found including this information at this point in the chart is beneficial and informative.    Past Medical History:  Diagnosis Date  . Anxiety   . Asthma   . Depression     Past Surgical History:  Procedure Laterality Date  . NECK SURGERY  05/2018    Family History  Problem Relation Age of Onset  . COPD Mother   . Alcohol abuse Father   . Colon cancer Neg Hx     Social History Social History   Tobacco Use  . Smoking status: Current Every Day Smoker    Packs/day: 0.50    Years: 18.00    Pack years: 9.00    Types: Cigarettes  . Smokeless tobacco: Never Used  Substance Use Topics  . Alcohol use: No  . Drug use: No    Allergies  Allergen Reactions  .  Aspirin Swelling    Only to the lips  . Aspirin Swelling    Blisters  . Tramadol Nausea And Vomiting    headache    Current Outpatient Medications  Medication Sig Dispense Refill  . albuterol (PROVENTIL HFA;VENTOLIN HFA) 108 (90 Base) MCG/ACT inhaler Inhale 2 puffs into the lungs 2 (two) times daily as needed for wheezing or shortness of breath. 8 g 2  . albuterol (PROVENTIL) (2.5 MG/3ML) 0.083% nebulizer solution Take 2.5 mg by nebulization every 6 (six) hours as needed for wheezing or shortness of breath.    . beclomethasone (QVAR REDIHALER) 40 MCG/ACT inhaler Inhale 2 puffs into the lungs 2 (two) times daily.    . diazepam (VALIUM) 5 MG tablet Take 1-2 tablets (5-10 mg total) by mouth every 6 (six) hours as needed for muscle spasms. 30 tablet 0  . diclofenac (VOLTAREN) 75 MG EC tablet Take 75 mg by mouth 2 (two) times daily as needed (inflammation/pain.).    Marland Kitchen HYDROcodone-acetaminophen (NORCO/VICODIN) 5-325 MG tablet Take one tab po q 4 hrs prn pain 8 tablet 0  . Lidocaine (HM LIDOCAINE PATCH) 4 % PTCH Apply 1 patch topically every 12 (twelve) hours. 10 patch 6  . LORazepam (ATIVAN) 1 MG tablet Take 1 mg by mouth daily as  needed for anxiety.     . methocarbamol (ROBAXIN) 500 MG tablet Take 500 mg by mouth every 8 (eight) hours as needed for spasms.    . montelukast (SINGULAIR) 10 MG tablet Take 1 tablet (10 mg total) by mouth at bedtime. (Patient taking differently: Take 10 mg by mouth daily as needed (allergies.). ) 90 tablet 1  . pantoprazole (PROTONIX) 40 MG tablet Take 1 tablet (40 mg total) by mouth daily before breakfast. 90 tablet 1  . traZODone (DESYREL) 50 MG tablet Take 50 mg by mouth at bedtime.    . valACYclovir (VALTREX) 500 MG tablet Take 500 mg by mouth 2 (two) times daily as needed.    . venlafaxine XR (EFFEXOR-XR) 37.5 MG 24 hr capsule Take 37.5 mg by mouth daily.     No current facility-administered medications for this visit.     Physical Exam  Height 5\' 4"  (1.626  m), weight 95 lb (43.1 kg).  Constitutional: overall normal hygiene, normal nutrition, well developed, normal grooming, normal body habitus. Assistive device:cane  Musculoskeletal: gait and station Limp left, muscle tone and strength are normal, no tremors or atrophy is present.  .  Neurological: coordination overall normal.  Deep tendon reflex/nerve stretch intact.  Sensation normal.  Cranial nerves II-XII intact.   Skin:   Normal overall no scars, lesions, ulcers or rashes. No psoriasis.  Psychiatric: Alert and oriented x 3.  Recent memory intact, remote memory unclear.  Normal mood and affect. Well groomed.  Good eye contact.  Cardiovascular: overall no swelling, no varicosities, no edema bilaterally, normal temperatures of the legs and arms, no clubbing, cyanosis and good capillary refill.  Lymphatic: palpation is normal.  The left posterior wound has healed.  She has marked sensitivity of the left lower leg posterior around the mid to distal Achilles. She has some swelling of the left foot and ankle. ROM of the ankle is painful.  She uses a cane.  NV intact.  She has no redness.  All other systems reviewed and are negative   The patient has been educated about the nature of the problem(s) and counseled on treatment options.  The patient appeared to understand what I have discussed and is in agreement with it.  Encounter Diagnoses  Name Primary?  . Strain of left ankle, subsequent encounter   . Achilles tendinitis, left leg Yes    PLAN Call if any problems.  Precautions discussed.  Continue current medications.   Return to clinic 2 weeks Out of work.  Continue PT. Resume the CAM walker.  Do the desensitization techniques.  Electronically Signed Sanjuana Kava, MD 2/16/20213:21 PM

## 2019-05-27 NOTE — Patient Instructions (Signed)
Continue PT.  OUT OF WORK.

## 2019-06-03 ENCOUNTER — Telehealth: Payer: Self-pay | Admitting: Orthopaedic Surgery

## 2019-06-03 NOTE — Telephone Encounter (Signed)
Keep doing as she is doing.  Try to weight bear as tolerated.  Continue PT

## 2019-06-03 NOTE — Telephone Encounter (Signed)
Stacy Rogers called stating that she is having constant pain in her heel down to the bottom of her toes.  She said it is very painful.  It is constant but worse at night.  She said it feels like "glass shards".  She is still wearing the boot.  She wants to know what she should do?  Please advise  Thanks

## 2019-06-03 NOTE — Telephone Encounter (Signed)
Patient advised.

## 2019-06-10 ENCOUNTER — Ambulatory Visit (INDEPENDENT_AMBULATORY_CARE_PROVIDER_SITE_OTHER): Admitting: Orthopaedic Surgery

## 2019-06-10 ENCOUNTER — Other Ambulatory Visit: Payer: Self-pay

## 2019-06-10 ENCOUNTER — Encounter: Payer: Self-pay | Admitting: Orthopaedic Surgery

## 2019-06-10 VITALS — BP 93/74 | HR 87 | Temp 98.4°F | Ht 64.0 in | Wt 99.2 lb

## 2019-06-10 DIAGNOSIS — M7662 Achilles tendinitis, left leg: Secondary | ICD-10-CM

## 2019-06-10 DIAGNOSIS — S96912D Strain of unspecified muscle and tendon at ankle and foot level, left foot, subsequent encounter: Secondary | ICD-10-CM | POA: Diagnosis not present

## 2019-06-10 DIAGNOSIS — F17210 Nicotine dependence, cigarettes, uncomplicated: Secondary | ICD-10-CM | POA: Diagnosis not present

## 2019-06-10 NOTE — Patient Instructions (Addendum)

## 2019-06-10 NOTE — Progress Notes (Signed)
Patient [id number] Stacy Rogers, female DOB:1985-10-19, 34 y.o. [id number]  Chief Complaint  Patient presents with  . Leg Pain    L/better some ways the same in others    HPI  Stacy Rogers is a 34 y.o. female who has left Achilles pain post superficial laceration.  She is going to PT and is making progress, although slowly.  She uses the CAM walker. She has some swelling still in the lower Achilles area on the left but no redness.   Body mass index is 17.04 kg/m.  ROS  Review of Systems  Constitutional: Positive for activity change.  Musculoskeletal: Positive for gait problem and joint swelling.  Psychiatric/Behavioral: The patient is nervous/anxious.   All other systems reviewed and are negative.   All other systems reviewed and are negative.  The following is a summary of the past history medically, past history surgically, known current medicines, social history and family history.  This information is gathered electronically by the computer from prior information and documentation.  I review this each visit and have found including this information at this point in the chart is beneficial and informative.    Past Medical History:  Diagnosis Date  . Anxiety   . Asthma   . Depression     Past Surgical History:  Procedure Laterality Date  . NECK SURGERY  05/2018    Family History  Problem Relation Age of Onset  . COPD Mother   . Alcohol abuse Father   . Colon cancer Neg Hx     Social History Social History   Tobacco Use  . Smoking status: Current Every Day Smoker    Packs/day: 0.50    Years: 18.00    Pack years: 9.00    Types: Cigarettes  . Smokeless tobacco: Never Used  Substance Use Topics  . Alcohol use: No  . Drug use: No    Allergies  Allergen Reactions  . Aspirin Swelling    Only to the lips  . Aspirin Swelling    Blisters  . Tramadol Nausea And Vomiting    headache    Current Outpatient Medications  Medication Sig Dispense Refill  .  albuterol (PROVENTIL HFA;VENTOLIN HFA) 108 (90 Base) MCG/ACT inhaler Inhale 2 puffs into the lungs 2 (two) times daily as needed for wheezing or shortness of breath. 8 g 2  . albuterol (PROVENTIL) (2.5 MG/3ML) 0.083% nebulizer solution Take 2.5 mg by nebulization every 6 (six) hours as needed for wheezing or shortness of breath.    . beclomethasone (QVAR REDIHALER) 40 MCG/ACT inhaler Inhale 2 puffs into the lungs 2 (two) times daily.    . diazepam (VALIUM) 5 MG tablet Take 1-2 tablets (5-10 mg total) by mouth every 6 (six) hours as needed for muscle spasms. 30 tablet 0  . diclofenac (VOLTAREN) 75 MG EC tablet Take 75 mg by mouth 2 (two) times daily as needed (inflammation/pain.).    Marland Kitchen HYDROcodone-acetaminophen (NORCO/VICODIN) 5-325 MG tablet Take one tab po q 4 hrs prn pain 8 tablet 0  . Lidocaine (HM LIDOCAINE PATCH) 4 % PTCH Apply 1 patch topically every 12 (twelve) hours. 10 patch 6  . LORazepam (ATIVAN) 1 MG tablet Take 1 mg by mouth daily as needed for anxiety.     . methocarbamol (ROBAXIN) 500 MG tablet Take 500 mg by mouth every 8 (eight) hours as needed for spasms.    . montelukast (SINGULAIR) 10 MG tablet Take 1 tablet (10 mg total) by mouth at bedtime. (Patient taking differently: Take 10  mg by mouth daily as needed (allergies.). ) 90 tablet 1  . pantoprazole (PROTONIX) 40 MG tablet Take 1 tablet (40 mg total) by mouth daily before breakfast. 90 tablet 1  . traZODone (DESYREL) 50 MG tablet Take 50 mg by mouth at bedtime.    . valACYclovir (VALTREX) 500 MG tablet Take 500 mg by mouth 2 (two) times daily as needed.    . venlafaxine XR (EFFEXOR-XR) 37.5 MG 24 hr capsule Take 37.5 mg by mouth daily.     No current facility-administered medications for this visit.     Physical Exam  Blood pressure 93/74, pulse 87, temperature 98.4 F (36.9 C), height 5\' 4"  (1.626 m), weight 99 lb 4 oz (45 kg).  Constitutional: overall normal hygiene, normal nutrition, well developed, normal grooming,  normal body habitus. Assistive device:CAM walker  Musculoskeletal: gait and station Limp left, muscle tone and strength are normal, no tremors or atrophy is present.  .  Neurological: coordination overall normal.  Deep tendon reflex/nerve stretch intact.  Sensation normal.  Cranial nerves II-XII intact.   Skin:   Normal overall no scars, lesions, ulcers or rashes. No psoriasis.  Psychiatric: Alert and oriented x 3.  Recent memory intact, remote memory unclear.  Normal mood and affect. Well groomed.  Good eye contact.  Cardiovascular: overall no swelling, no varicosities, no edema bilaterally, normal temperatures of the legs and arms, no clubbing, cyanosis and good capillary refill.  Lymphatic: palpation is normal.  Left distal Achilles is tender and has some swelling  No redness is present.  NV intact. ROM of ankle is dorsiflexion 15, plantar flexion 10, inversion/eversion painful.   All other systems reviewed and are negative   The patient has been educated about the nature of the problem(s) and counseled on treatment options.  The patient appeared to understand what I have discussed and is in agreement with it.  Encounter Diagnoses  Name Primary?  . Strain of left ankle, subsequent encounter Yes  . Achilles tendinitis, left leg   . Nicotine dependence, cigarettes, uncomplicated     PLAN Call if any problems.  Precautions discussed.  Continue current medications.   Return to clinic 2 weeks   Stay out of work  Continue PT  I have explained exercises using marbles and two boxes. She will do this daily.  Electronically Signed Sanjuana Kava, MD 3/2/20212:41 PM

## 2019-06-13 NOTE — Patient Instructions (Signed)
Stacy Rogers  06/13/2019     @PREFPERIOPPHARMACY @   Your procedure is scheduled on   06/19/2019   Report to [facility] at  0630   A.M.  Call this number if you have problems the morning of surgery:  [phone]   Remember:  Follow the diet instructions given to you by Dr [phone] office.                       Take these medicines the morning of surgery with A SIP OF WATER  Valium(if needed) diclofenac(if needed), hydrocodone(if needed), ativan(if needed), robaxin(if needed), protonix, effexor.    Do not wear jewelry, make-up or nail polish.  Do not wear lotions, powders, or perfumes. Please wear deodorant and brush your teeth.  Do not shave 48 hours prior to surgery.  Men may shave face and neck.  Do not bring valuables to the hospital.  [facility] is not responsible for any belongings or valuables.  Contacts, dentures or bridgework may not be worn into surgery.  Leave your suitcase in the car.  After surgery it may be brought to your room.  For patients admitted to the hospital, discharge time will be determined by your treatment team.  Patients discharged the day of surgery will not be allowed to drive home.   Name and phone number of your driver:   family Special instructions:  DO NOT smoke the day of your procedure.  Please read over the following fact sheets that you were given. Anesthesia Post-op Instructions and Care and Recovery After Surgery       Upper Endoscopy, Adult, Care After This sheet gives you information about how to care for yourself after your procedure. Your health care provider may also give you more specific instructions. If you have problems or questions, contact your health care provider. What can I expect after the procedure? After the procedure, it is common to have:  A sore throat.  Mild stomach pain or discomfort.  Bloating.  Nausea. Follow these instructions at home:   Follow instructions from your health care provider  about what to eat or drink after your procedure.  Return to your normal activities as told by your health care provider. Ask your health care provider what activities are safe for you.  Take over-the-counter and prescription medicines only as told by your health care provider.  Do not drive for 24 hours if you were given a sedative during your procedure.  Keep all follow-up visits as told by your health care provider. This is important. Contact a health care provider if you have:  A sore throat that lasts longer than one day.  Trouble swallowing. Get help right away if:  You vomit blood or your vomit looks like coffee grounds.  You have: ? A fever. ? Bloody, black, or tarry stools. ? A severe sore throat or you cannot swallow. ? Difficulty breathing. ? Severe pain in your chest or abdomen. Summary  After the procedure, it is common to have a sore throat, mild stomach discomfort, bloating, and nausea.  Do not drive for 24 hours if you were given a sedative during the procedure.  Follow instructions from your health care provider about what to eat or drink after your procedure.  Return to your normal activities as told by your health care provider. This information is not intended to replace advice given to you by your health care provider. Make sure you discuss any  questions you have with your health care provider. Document Revised: 09/18/2017 Document Reviewed: 08/27/2017 Elsevier Patient Education  2020 [place] After These instructions provide you with information about caring for yourself after your procedure. Your health care provider may also give you more specific instructions. Your treatment has been planned according to current medical practices, but problems sometimes occur. Call your health care provider if you have any problems or questions after your procedure. What can I expect after the procedure? After your procedure, you  may:  Feel sleepy for several hours.  Feel clumsy and have poor balance for several hours.  Feel forgetful about what happened after the procedure.  Have poor judgment for several hours.  Feel nauseous or vomit.  Have a sore throat if you had a breathing tube during the procedure. Follow these instructions at home: For at least 24 hours after the procedure:      Have a responsible adult stay with you. It is important to have someone help care for you until you are awake and alert.  Rest as needed.  Do not: ? Participate in activities in which you could fall or become injured. ? Drive. ? Use heavy machinery. ? Drink alcohol. ? Take sleeping pills or medicines that cause drowsiness. ? Make important decisions or sign legal documents. ? Take care of children on your own. Eating and drinking  Follow the diet that is recommended by your health care provider.  If you vomit, drink water, juice, or soup when you can drink without vomiting.  Make sure you have little or no nausea before eating solid foods. General instructions  Take over-the-counter and prescription medicines only as told by your health care provider.  If you have sleep apnea, surgery and certain medicines can increase your risk for breathing problems. Follow instructions from your health care provider about wearing your sleep device: ? Anytime you are sleeping, including during daytime naps. ? While taking prescription pain medicines, sleeping medicines, or medicines that make you drowsy.  If you smoke, do not smoke without supervision.  Keep all follow-up visits as told by your health care provider. This is important. Contact a health care provider if:  You keep feeling nauseous or you keep vomiting.  You feel light-headed.  You develop a rash.  You have a fever. Get help right away if:  You have trouble breathing. Summary  For several hours after your procedure, you may feel sleepy and have  poor judgment.  Have a responsible adult stay with you for at least 24 hours or until you are awake and alert. This information is not intended to replace advice given to you by your health care provider. Make sure you discuss any questions you have with your health care provider. Document Revised: 06/25/2017 Document Reviewed: 07/18/2015 Elsevier Patient Education  [place].

## 2019-06-17 ENCOUNTER — Encounter (HOSPITAL_COMMUNITY): Payer: Self-pay

## 2019-06-17 ENCOUNTER — Encounter (HOSPITAL_COMMUNITY)
Admission: RE | Admit: 2019-06-17 | Discharge: 2019-06-17 | Disposition: A | Discharge status: Home / Self Care | Payer: BC Managed Care – PPO | Source: Ambulatory Visit | Attending: Internal Medicine | Admitting: Internal Medicine

## 2019-06-17 ENCOUNTER — Telehealth: Payer: Self-pay

## 2019-06-17 ENCOUNTER — Other Ambulatory Visit: Payer: Self-pay

## 2019-06-17 ENCOUNTER — Other Ambulatory Visit (HOSPITAL_COMMUNITY)
Admission: RE | Admit: 2019-06-17 | Discharge: 2019-06-17 | Disposition: A | Discharge status: Home / Self Care | Payer: BC Managed Care – PPO | Source: Ambulatory Visit | Attending: Internal Medicine | Admitting: Internal Medicine

## 2019-06-17 NOTE — Telephone Encounter (Signed)
Received message from pre-op nurse, pt was no show for pre-op appt today. Pre-op nurse was unable to contact pt.  Tried to call pt, no answer, no VM set-up.

## 2019-06-18 NOTE — Telephone Encounter (Signed)
Called pt, VM not set up. Not able to leave message 

## 2019-06-18 NOTE — Telephone Encounter (Signed)
Spoke with Eber Jones in endo. Patient will have to be rescheduled if she calls back. Orders placed in depot

## 2019-06-19 ENCOUNTER — Ambulatory Visit (HOSPITAL_COMMUNITY)
Admission: RE | Admit: 2019-06-19 | Payer: BC Managed Care – PPO | Source: Home / Self Care | Admitting: Internal Medicine

## 2019-06-19 ENCOUNTER — Encounter (HOSPITAL_COMMUNITY): Admission: RE | Payer: Self-pay | Source: Home / Self Care

## 2019-06-19 SURGERY — ESOPHAGOGASTRODUODENOSCOPY (EGD) WITH PROPOFOL
Anesthesia: Monitor Anesthesia Care

## 2019-06-19 NOTE — Telephone Encounter (Addendum)
Pt never called office. Procedure cancelled. FYI to LSL.

## 2019-06-19 NOTE — Telephone Encounter (Signed)
noted 

## 2019-06-24 ENCOUNTER — Other Ambulatory Visit: Payer: Self-pay

## 2019-06-24 ENCOUNTER — Ambulatory Visit (INDEPENDENT_AMBULATORY_CARE_PROVIDER_SITE_OTHER): Admitting: Orthopaedic Surgery

## 2019-06-24 ENCOUNTER — Encounter: Payer: Self-pay | Admitting: Orthopaedic Surgery

## 2019-06-24 VITALS — BP 130/72 | HR 74 | Temp 98.4°F | Ht 64.0 in | Wt 99.0 lb

## 2019-06-24 DIAGNOSIS — F17210 Nicotine dependence, cigarettes, uncomplicated: Secondary | ICD-10-CM

## 2019-06-24 DIAGNOSIS — M7662 Achilles tendinitis, left leg: Secondary | ICD-10-CM | POA: Diagnosis not present

## 2019-06-24 DIAGNOSIS — S96912D Strain of unspecified muscle and tendon at ankle and foot level, left foot, subsequent encounter: Secondary | ICD-10-CM | POA: Diagnosis not present

## 2019-06-24 NOTE — Patient Instructions (Signed)
OUT OF WORK ?

## 2019-06-24 NOTE — Progress Notes (Signed)
Patient PV:VZSMOLMBE Stacy Rogers, female DOB:30-Oct-1985, 34 y.o. [id number]  Chief Complaint  Patient presents with  . Ankle Pain    Strain of left ankle.    HPI  Stacy Rogers is a 34 y.o. female who has Achilles pain left.  She had injury when getting out of shower last Thursday that put more stress on the left heel area.  She has been to PT and has completed it.  I do not have copies of her last notes.  I had requested them yesterday and still have not received them.  She has tightness of the left heel cord.  She is still in CAM walker.  I told her to stop the CAM walker.  She walks stiff legged on the left.  I have shown her walking techniques in the office today.  I have shown her exercises for standing tip toed next to her sink.  She needs to do these at home now several times a day.  She needs to do stretching exercises at home that she learned in PT as well.  I will keep her out of work.  Continue present medicines.  Return in two weeks.   Body mass index is 16.99 kg/m.  ROS  Review of Systems  Constitutional: Positive for activity change.  Musculoskeletal: Positive for gait problem and joint swelling.  Psychiatric/Behavioral: The patient is nervous/anxious.   All other systems reviewed and are negative.   All other systems reviewed and are negative.  The following is a summary of the past history medically, past history surgically, known current medicines, social history and family history.  This information is gathered electronically by the computer from prior information and documentation.  I review this each visit and have found including this information at this point in the chart is beneficial and informative.    Past Medical History:  Diagnosis Date  . Anxiety   . Asthma   . Depression     Past Surgical History:  Procedure Laterality Date  . NECK SURGERY  05/2018    Family History  Problem Relation Age of Onset  . COPD Mother   . Alcohol abuse Father    . Colon cancer Neg Hx     Social History Social History   Tobacco Use  . Smoking status: Current Every Day Smoker    Packs/day: 0.50    Years: 18.00    Pack years: 9.00    Types: Cigarettes  . Smokeless tobacco: Never Used  Substance Use Topics  . Alcohol use: No  . Drug use: No    Allergies  Allergen Reactions  . Aspirin Swelling    Only to the lips  . Aspirin Swelling    Blisters  . Tramadol Nausea And Vomiting    headache    Current Outpatient Medications  Medication Sig Dispense Refill  . albuterol (PROVENTIL HFA;VENTOLIN HFA) 108 (90 Base) MCG/ACT inhaler Inhale 2 puffs into the lungs 2 (two) times daily as needed for wheezing or shortness of breath. 8 g 2  . albuterol (PROVENTIL) (2.5 MG/3ML) 0.083% nebulizer solution Take 2.5 mg by nebulization every 6 (six) hours as needed for wheezing or shortness of breath.    . beclomethasone (QVAR REDIHALER) 40 MCG/ACT inhaler Inhale 2 puffs into the lungs 2 (two) times daily.    . diazepam (VALIUM) 5 MG tablet Take 1-2 tablets (5-10 mg total) by mouth every 6 (six) hours as needed for muscle spasms. 30 tablet 0  . diclofenac (VOLTAREN) 75 MG EC tablet Take  75 mg by mouth 2 (two) times daily as needed (inflammation/pain.).    Marland Kitchen HYDROcodone-acetaminophen (NORCO/VICODIN) 5-325 MG tablet Take one tab po q 4 hrs prn pain 8 tablet 0  . Lidocaine (HM LIDOCAINE PATCH) 4 % PTCH Apply 1 patch topically every 12 (twelve) hours. 10 patch 6  . LORazepam (ATIVAN) 1 MG tablet Take 1 mg by mouth daily as needed for anxiety.     . methocarbamol (ROBAXIN) 500 MG tablet Take 500 mg by mouth every 8 (eight) hours as needed for spasms.    . montelukast (SINGULAIR) 10 MG tablet Take 1 tablet (10 mg total) by mouth at bedtime. (Patient taking differently: Take 10 mg by mouth daily as needed (allergies.). ) 90 tablet 1  . pantoprazole (PROTONIX) 40 MG tablet Take 1 tablet (40 mg total) by mouth daily before breakfast. 90 tablet 1  . traZODone  (DESYREL) 50 MG tablet Take 50 mg by mouth at bedtime.    . valACYclovir (VALTREX) 500 MG tablet Take 500 mg by mouth 2 (two) times daily as needed.    . venlafaxine XR (EFFEXOR-XR) 37.5 MG 24 hr capsule Take 37.5 mg by mouth daily.     No current facility-administered medications for this visit.     Physical Exam  Blood pressure 130/72, pulse 74, temperature 98.4 F (36.9 C), height 5\' 4"  (1.626 m), weight 99 lb (44.9 kg).  Constitutional: overall normal hygiene, normal nutrition, well developed, normal grooming, normal body habitus. Assistive device:CAM walker left  Musculoskeletal: gait and station Limp left ,marked, muscle tone and strength are normal, no tremors or atrophy is present.  .  Neurological: coordination overall normal.  Deep tendon reflex/nerve stretch intact.  Sensation normal.  Cranial nerves II-XII intact.   Skin:   Normal overall no scars, lesions, ulcers or rashes. No psoriasis.  Psychiatric: Alert and oriented x 3.  Recent memory intact, remote memory unclear.  Normal mood and affect. Well groomed.  Good eye contact.  Cardiovascular: overall no swelling, no varicosities, no edema bilaterally, normal temperatures of the legs and arms, no clubbing, cyanosis and good capillary refill.  Lymphatic: palpation is normal.  She has much decreased sensitivity around the well healed wound of the distal left lower leg over the Achilles.  Her Achilles is tight. NV intact.  Gait abnormal.  All other systems reviewed and are negative   The patient has been educated about the nature of the problem(s) and counseled on treatment options.  The patient appeared to understand what I have discussed and is in agreement with it.  Encounter Diagnoses  Name Primary?  . Strain of left ankle, subsequent encounter Yes  . Achilles tendinitis, left leg   . Nicotine dependence, cigarettes, uncomplicated     PLAN Call if any problems.  Precautions discussed.  Continue current  medications.   Return to clinic 2 weeks   Out of work.  Electronically Signed Sanjuana Kava, MD 3/16/20219:20 AM

## 2019-07-08 ENCOUNTER — Other Ambulatory Visit: Payer: Self-pay

## 2019-07-08 ENCOUNTER — Ambulatory Visit (INDEPENDENT_AMBULATORY_CARE_PROVIDER_SITE_OTHER): Admitting: Orthopaedic Surgery

## 2019-07-08 ENCOUNTER — Encounter: Payer: Self-pay | Admitting: Orthopaedic Surgery

## 2019-07-08 VITALS — Ht 64.0 in | Wt 98.8 lb

## 2019-07-08 DIAGNOSIS — M7662 Achilles tendinitis, left leg: Secondary | ICD-10-CM

## 2019-07-08 NOTE — Progress Notes (Signed)
Patient YP:PJKDTOIZT Shimko, female DOB:06/30/1985, 34 y.o. [id number]  Chief Complaint  Patient presents with  . Leg Pain    Left ankle    HPI  Stacy Rogers is a 34 y.o. female who has pain of the left Achilles area.  She is improved. She is walking better but still has a limp.  I have shown her more exercises to do at home and try to push off with her toes when walking.  She has no new trauma.   Body mass index is 16.96 kg/m.  ROS  Review of Systems  Constitutional: Positive for activity change.  Musculoskeletal: Positive for gait problem and joint swelling.  Psychiatric/Behavioral: The patient is nervous/anxious.   All other systems reviewed and are negative.   All other systems reviewed and are negative.  The following is a summary of the past history medically, past history surgically, known current medicines, social history and family history.  This information is gathered electronically by the computer from prior information and documentation.  I review this each visit and have found including this information at this point in the chart is beneficial and informative.    Past Medical History:  Diagnosis Date  . Anxiety   . Asthma   . Depression     Past Surgical History:  Procedure Laterality Date  . NECK SURGERY  05/2018    Family History  Problem Relation Age of Onset  . COPD Mother   . Alcohol abuse Father   . Colon cancer Neg Hx     Social History Social History   Tobacco Use  . Smoking status: Current Every Day Smoker    Packs/day: 0.50    Years: 18.00    Pack years: 9.00    Types: Cigarettes  . Smokeless tobacco: Never Used  Substance Use Topics  . Alcohol use: No  . Drug use: No    Allergies  Allergen Reactions  . Aspirin Swelling    Only to the lips  . Aspirin Swelling    Blisters  . Tramadol Nausea And Vomiting    headache    Current Outpatient Medications  Medication Sig Dispense Refill  . albuterol (PROVENTIL  HFA;VENTOLIN HFA) 108 (90 Base) MCG/ACT inhaler Inhale 2 puffs into the lungs 2 (two) times daily as needed for wheezing or shortness of breath. 8 g 2  . albuterol (PROVENTIL) (2.5 MG/3ML) 0.083% nebulizer solution Take 2.5 mg by nebulization every 6 (six) hours as needed for wheezing or shortness of breath.    . beclomethasone (QVAR REDIHALER) 40 MCG/ACT inhaler Inhale 2 puffs into the lungs 2 (two) times daily.    Marland Kitchen LORazepam (ATIVAN) 1 MG tablet Take 1 mg by mouth daily as needed for anxiety.     . methocarbamol (ROBAXIN) 500 MG tablet Take 500 mg by mouth every 8 (eight) hours as needed for spasms.    . montelukast (SINGULAIR) 10 MG tablet Take 1 tablet (10 mg total) by mouth at bedtime. (Patient taking differently: Take 10 mg by mouth daily as needed (allergies.). ) 90 tablet 1  . traZODone (DESYREL) 50 MG tablet Take 50 mg by mouth at bedtime.    . valACYclovir (VALTREX) 500 MG tablet Take 500 mg by mouth 2 (two) times daily as needed.     No current facility-administered medications for this visit.     Physical Exam  Height 5\' 4"  (1.626 m), weight 98 lb 12.8 oz (44.8 kg).  Constitutional: overall normal hygiene, normal nutrition, well developed, normal grooming, normal  body habitus. Assistive device:none  Musculoskeletal: gait and station Limp left. She does not push off with toes, has flat footed gait., muscle tone and strength are normal, no tremors or atrophy is present.  .  Neurological: coordination overall normal.  Deep tendon reflex/nerve stretch intact.  Sensation normal.  Cranial nerves II-XII intact.   Skin:   Normal overall no scars, lesions, ulcers or rashes. No psoriasis.  Psychiatric: Alert and oriented x 3.  Recent memory intact, remote memory unclear.  Normal mood and affect. Well groomed.  Good eye contact.  Cardiovascular: overall no swelling, no varicosities, no edema bilaterally, normal temperatures of the legs and arms, no clubbing, cyanosis and good capillary  refill.  Lymphatic: palpation is normal.  All other systems reviewed and are negative   The patient has been educated about the nature of the problem(s) and counseled on treatment options.  The patient appeared to understand what I have discussed and is in agreement with it.  Encounter Diagnosis  Name Primary?  . Achilles tendinitis, left leg Yes    PLAN Call if any problems.  Precautions discussed.  Continue current medications.   Return to clinic 2 weeks   Do the exercises.  Stay out of work.  Consider returning to work in two weeks.  Electronically Signed Sanjuana Kava, MD 3/30/20219:22 AM

## 2019-07-08 NOTE — Patient Instructions (Signed)
Out of work 

## 2019-07-16 ENCOUNTER — Telehealth: Payer: Self-pay | Admitting: Orthopaedic Surgery

## 2019-07-16 NOTE — Telephone Encounter (Signed)
Call received from [employer] from newly assigned nurse case manager, [place], ph# [phone] / fax# [phone]. 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

## 2019-07-22 ENCOUNTER — Encounter: Payer: Self-pay | Admitting: Orthopaedic Surgery

## 2019-07-22 ENCOUNTER — Ambulatory Visit (INDEPENDENT_AMBULATORY_CARE_PROVIDER_SITE_OTHER): Admitting: Orthopaedic Surgery

## 2019-07-22 ENCOUNTER — Other Ambulatory Visit: Payer: Self-pay

## 2019-07-22 VITALS — Temp 99.6°F | Ht 64.0 in | Wt 98.0 lb

## 2019-07-22 DIAGNOSIS — M7662 Achilles tendinitis, left leg: Secondary | ICD-10-CM | POA: Diagnosis not present

## 2019-07-22 DIAGNOSIS — F17210 Nicotine dependence, cigarettes, uncomplicated: Secondary | ICD-10-CM

## 2019-07-22 NOTE — Patient Instructions (Signed)
Out of work 

## 2019-07-22 NOTE — Progress Notes (Signed)
Patient Stacy Rogers, female DOB:04-02-1986, 33 y.o. [id number]  Chief Complaint  Patient presents with  . Ankle Injury    Left achilles    HPI  Stacy Rogers is a 34 y.o. female who has left ankle pain.  She has better gait today.  I watched as she walked in.  She works a 12 hour shift. I reviewed her job description.  She says there is no light duty work.  Ideally I would have her return to a four hour day for a couple of weeks, then six hour day for two weeks, then eight hour day for two weeks, then regular work.  She says they do not have this option.  I will keep her out of work.  She is to do walking exercises at home and go to stores and just walk.   Body mass index is 16.82 kg/m.  ROS  Review of Systems  Constitutional: Positive for activity change.  Musculoskeletal: Positive for gait problem and joint swelling.  Psychiatric/Behavioral: The patient is nervous/anxious.   All other systems reviewed and are negative.   All other systems reviewed and are negative.  The following is a summary of the past history medically, past history surgically, known current medicines, social history and family history.  This information is gathered electronically by the computer from prior information and documentation.  I review this each visit and have found including this information at this point in the chart is beneficial and informative.    Past Medical History:  Diagnosis Date  . Anxiety   . Asthma   . Depression     Past Surgical History:  Procedure Laterality Date  . NECK SURGERY  05/2018    Family History  Problem Relation Age of Onset  . COPD Mother   . Alcohol abuse Father   . Colon cancer Neg Hx     Social History Social History   Tobacco Use  . Smoking status: Current Every Day Smoker    Packs/day: 0.50    Years: 18.00    Pack years: 9.00    Types: Cigarettes  . Smokeless tobacco: Never Used  Substance Use Topics  . Alcohol use: No  .  Drug use: No    Allergies  Allergen Reactions  . Aspirin Swelling    Only to the lips  . Aspirin Swelling    Blisters  . Tramadol Nausea And Vomiting    headache    Current Outpatient Medications  Medication Sig Dispense Refill  . albuterol (PROVENTIL HFA;VENTOLIN HFA) 108 (90 Base) MCG/ACT inhaler Inhale 2 puffs into the lungs 2 (two) times daily as needed for wheezing or shortness of breath. 8 g 2  . albuterol (PROVENTIL) (2.5 MG/3ML) 0.083% nebulizer solution Take 2.5 mg by nebulization every 6 (six) hours as needed for wheezing or shortness of breath.    . beclomethasone (QVAR REDIHALER) 40 MCG/ACT inhaler Inhale 2 puffs into the lungs 2 (two) times daily.    Marland Kitchen LORazepam (ATIVAN) 1 MG tablet Take 1 mg by mouth daily as needed for anxiety.     . methocarbamol (ROBAXIN) 500 MG tablet Take 500 mg by mouth every 8 (eight) hours as needed for spasms.    . montelukast (SINGULAIR) 10 MG tablet Take 1 tablet (10 mg total) by mouth at bedtime. (Patient taking differently: Take 10 mg by mouth daily as needed (allergies.). ) 90 tablet 1  . traZODone (DESYREL) 50 MG tablet Take 50 mg by mouth at bedtime.    Marland Kitchen  valACYclovir (VALTREX) 500 MG tablet Take 500 mg by mouth 2 (two) times daily as needed.     No current facility-administered medications for this visit.     Physical Exam  Temperature 99.6 F (37.6 C), height 5\' 4"  (1.626 m), weight 98 lb (44.5 kg).  Constitutional: overall normal hygiene, normal nutrition, well developed, normal grooming, normal body habitus. Assistive device:none  Musculoskeletal: gait and station Limp left only slightly, muscle tone and strength are normal, no tremors or atrophy is present.  .  Neurological: coordination overall normal.  Deep tendon reflex/nerve stretch intact.  Sensation normal.  Cranial nerves II-XII intact.   Skin:   Normal overall no scars, lesions, ulcers or rashes. No psoriasis.  Psychiatric: Alert and oriented x 3.  Recent memory  intact, remote memory unclear.  Normal mood and affect. Well groomed.  Good eye contact.  Cardiovascular: overall no swelling, no varicosities, no edema bilaterally, normal temperatures of the legs and arms, no clubbing, cyanosis and good capillary refill.  Lymphatic: palpation is normal.  Her gait is much improved.  She has a slight limp left but much better.  She said that when she tried to walk a lot she has some swelling and then pain of the ankle.  All other systems reviewed and are negative   The patient has been educated about the nature of the problem(s) and counseled on treatment options.  The patient appeared to understand what I have discussed and is in agreement with it.  Encounter Diagnoses  Name Primary?  . Achilles tendinitis, left leg Yes  . Nicotine dependence, cigarettes, uncomplicated     PLAN Call if any problems.  Precautions discussed.  Continue current medications.   Return to clinic 3 weeks   Out of work  Electronically Signed , MD 4/13/20213:01 PM

## 2019-07-24 ENCOUNTER — Telehealth: Payer: Self-pay | Admitting: Orthopaedic Surgery

## 2019-07-24 DIAGNOSIS — M7662 Achilles tendinitis, left leg: Secondary | ICD-10-CM

## 2019-07-24 DIAGNOSIS — S81812D Laceration without foreign body, left lower leg, subsequent encounter: Secondary | ICD-10-CM

## 2019-07-24 NOTE — Telephone Encounter (Signed)
Patient called requesting to speak with nurse - routed call.

## 2019-07-24 NOTE — Telephone Encounter (Signed)
Pt is requesting to have an MRI ordered at this time. Please advise.

## 2019-07-28 NOTE — Telephone Encounter (Signed)
OK.  Set it up but it is Worker's Comp.  Need to get it approved.

## 2019-07-29 NOTE — Telephone Encounter (Signed)
MRI ordered, given to [facility] to send to [facility].

## 2019-08-12 ENCOUNTER — Ambulatory Visit: Admitting: Orthopaedic Surgery

## 2019-09-04 ENCOUNTER — Other Ambulatory Visit: Payer: Self-pay

## 2019-09-04 ENCOUNTER — Encounter: Payer: Self-pay | Admitting: Orthopaedic Surgery

## 2019-09-04 ENCOUNTER — Ambulatory Visit (INDEPENDENT_AMBULATORY_CARE_PROVIDER_SITE_OTHER): Admitting: Orthopaedic Surgery

## 2019-09-04 VITALS — BP 104/70 | HR 76 | Ht 64.0 in | Wt 98.5 lb

## 2019-09-04 DIAGNOSIS — F17210 Nicotine dependence, cigarettes, uncomplicated: Secondary | ICD-10-CM | POA: Diagnosis not present

## 2019-09-04 DIAGNOSIS — M7662 Achilles tendinitis, left leg: Secondary | ICD-10-CM

## 2019-09-04 NOTE — Patient Instructions (Addendum)

## 2019-09-04 NOTE — Progress Notes (Signed)
Patient Stacy Rogers, female DOB:March 16, 1986, 34 y.o. [id number]  Chief Complaint  Patient presents with  . Ankle Pain    L/ here to go over MRI/doing better than before    HPI  Stacy Rogers is a 34 y.o. female who has Achilles pain on the left.  She had MRI done at [facility] and it showed thickening and tendinosis and/or injury with partial predominant interstitial tear.  I have gone over the findings with her.  This takes several months to recover.  She does not need surgery.  I have asked that she be as active as she can be at home and increase walking time.  Stay out of work.  Return in one month.   Body mass index is 16.91 kg/m.  ROS  Review of Systems  Constitutional: Positive for activity change.  Musculoskeletal: Positive for gait problem and joint swelling.  Psychiatric/Behavioral: The patient is nervous/anxious.   All other systems reviewed and are negative.   All other systems reviewed and are negative.  The following is a summary of the past history medically, past history surgically, known current medicines, social history and family history.  This information is gathered electronically by the computer from prior information and documentation.  I review this each visit and have found including this information at this point in the chart is beneficial and informative.    Past Medical History:  Diagnosis Date  . Anxiety   . Asthma   . Depression     Past Surgical History:  Procedure Laterality Date  . NECK SURGERY  05/2018    Family History  Problem Relation Age of Onset  . COPD Mother   . Alcohol abuse Father   . Colon cancer Neg Hx     Social History Social History   Tobacco Use  . Smoking status: Current Every Day Smoker    Packs/day: 0.50    Years: 18.00    Pack years: 9.00    Types: Cigarettes  . Smokeless tobacco: Never Used  Substance Use Topics  . Alcohol use: No  . Drug use: No    Allergies  Allergen Reactions  .  Aspirin Swelling    Only to the lips  . Aspirin Swelling    Blisters  . Tramadol Nausea And Vomiting    headache    Current Outpatient Medications  Medication Sig Dispense Refill  . albuterol (PROVENTIL HFA;VENTOLIN HFA) 108 (90 Base) MCG/ACT inhaler Inhale 2 puffs into the lungs 2 (two) times daily as needed for wheezing or shortness of breath. 8 g 2  . albuterol (PROVENTIL) (2.5 MG/3ML) 0.083% nebulizer solution Take 2.5 mg by nebulization every 6 (six) hours as needed for wheezing or shortness of breath.    . beclomethasone (QVAR REDIHALER) 40 MCG/ACT inhaler Inhale 2 puffs into the lungs 2 (two) times daily.    Marland Kitchen LORazepam (ATIVAN) 1 MG tablet Take 1 mg by mouth daily as needed for anxiety.     . methocarbamol (ROBAXIN) 500 MG tablet Take 500 mg by mouth every 8 (eight) hours as needed for spasms.    . montelukast (SINGULAIR) 10 MG tablet Take 1 tablet (10 mg total) by mouth at bedtime. (Patient taking differently: Take 10 mg by mouth daily as needed (allergies.). ) 90 tablet 1  . traZODone (DESYREL) 50 MG tablet Take 50 mg by mouth at bedtime.    . valACYclovir (VALTREX) 500 MG tablet Take 500 mg by mouth 2 (two) times daily as needed.  No current facility-administered medications for this visit.     Physical Exam  Blood pressure 104/70, pulse 76, height 5\' 4"  (1.626 m), weight 98 lb 8 oz (44.7 kg).  Constitutional: overall normal hygiene, normal nutrition, well developed, normal grooming, normal body habitus. Assistive device:none  Musculoskeletal: gait and station Limp left, muscle tone and strength are normal, no tremors or atrophy is present.  .  Neurological: coordination overall normal.  Deep tendon reflex/nerve stretch intact.  Sensation normal.  Cranial nerves II-XII intact.   Skin:   Normal overall no scars, lesions, ulcers or rashes. No psoriasis.  Psychiatric: Alert and oriented x 3.  Recent memory intact, remote memory unclear.  Normal mood and affect. Well  groomed.  Good eye contact.  Cardiovascular: overall no swelling, no varicosities, no edema bilaterally, normal temperatures of the legs and arms, no clubbing, cyanosis and good capillary refill.  Lymphatic: palpation is normal.  Left distal achilles with tenderness, slight thickening, NV intact.  ROM ankle is full.  All other systems reviewed and are negative   The patient has been educated about the nature of the problem(s) and counseled on treatment options.  The patient appeared to understand what I have discussed and is in agreement with it.  Encounter Diagnoses  Name Primary?  . Achilles tendinitis, left leg Yes  . Nicotine dependence, cigarettes, uncomplicated     PLAN Call if any problems.  Precautions discussed.  Continue current medications.   Return to clinic 1 month   Electronically Signed Sanjuana Kava, MD 5/27/202110:57 AM

## 2019-10-02 ENCOUNTER — Encounter: Payer: Self-pay | Admitting: Orthopaedic Surgery

## 2019-10-02 ENCOUNTER — Ambulatory Visit (INDEPENDENT_AMBULATORY_CARE_PROVIDER_SITE_OTHER): Admitting: Orthopaedic Surgery

## 2019-10-02 ENCOUNTER — Other Ambulatory Visit: Payer: Self-pay

## 2019-10-02 VITALS — BP 113/74 | HR 84 | Ht 64.0 in | Wt 98.0 lb

## 2019-10-02 DIAGNOSIS — M7662 Achilles tendinitis, left leg: Secondary | ICD-10-CM | POA: Diagnosis not present

## 2019-10-02 DIAGNOSIS — F17210 Nicotine dependence, cigarettes, uncomplicated: Secondary | ICD-10-CM | POA: Diagnosis not present

## 2019-10-02 NOTE — Progress Notes (Signed)
Patient Stacy Rogers, female DOB:05-14-1985, 34 y.o. EHM:[id number]  Chief Complaint  Patient presents with  . Ankle Injury    left achilles / better but still swelling     HPI  Stacy Rogers is a 34 y.o. female who has Achilles pain on the left.  She is making good progress.  She is walking better.  I will have her return to work October 13, 2019 full duty.   Body mass index is 16.82 kg/m.  ROS  Review of Systems  Constitutional: Positive for activity change.  Musculoskeletal: Positive for gait problem and joint swelling.  Psychiatric/Behavioral: The patient is nervous/anxious.   All other systems reviewed and are negative.   All other systems reviewed and are negative.  The following is a summary of the past history medically, past history surgically, known current medicines, social history and family history.  This information is gathered electronically by the computer from prior information and documentation.  I review this each visit and have found including this information at this point in the chart is beneficial and informative.    Past Medical History:  Diagnosis Date  . Anxiety   . Asthma   . Depression     Past Surgical History:  Procedure Laterality Date  . NECK SURGERY  05/2018    Family History  Problem Relation Age of Onset  . COPD Mother   . Alcohol abuse Father   . Colon cancer Neg Hx     Social History Social History   Tobacco Use  . Smoking status: Current Every Day Smoker    Packs/day: 0.50    Years: 18.00    Pack years: 9.00    Types: Cigarettes  . Smokeless tobacco: Never Used  Vaping Use  . Vaping Use: Never used  Substance Use Topics  . Alcohol use: No  . Drug use: No    Allergies  Allergen Reactions  . Aspirin Swelling    Only to the lips  . Aspirin Swelling    Blisters  . Tramadol Nausea And Vomiting    headache    Current Outpatient Medications  Medication Sig Dispense Refill  . albuterol (PROVENTIL  HFA;VENTOLIN HFA) 108 (90 Base) MCG/ACT inhaler Inhale 2 puffs into the lungs 2 (two) times daily as needed for wheezing or shortness of breath. 8 g 2  . albuterol (PROVENTIL) (2.5 MG/3ML) 0.083% nebulizer solution Take 2.5 mg by nebulization every 6 (six) hours as needed for wheezing or shortness of breath.    . beclomethasone (QVAR REDIHALER) 40 MCG/ACT inhaler Inhale 2 puffs into the lungs 2 (two) times daily.    Marland Kitchen LORazepam (ATIVAN) 1 MG tablet Take 1 mg by mouth daily as needed for anxiety.     . methocarbamol (ROBAXIN) 500 MG tablet Take 500 mg by mouth every 8 (eight) hours as needed for spasms.    . montelukast (SINGULAIR) 10 MG tablet Take 1 tablet (10 mg total) by mouth at bedtime. (Patient taking differently: Take 10 mg by mouth daily as needed (allergies.). ) 90 tablet 1  . traZODone (DESYREL) 50 MG tablet Take 50 mg by mouth at bedtime.    . valACYclovir (VALTREX) 500 MG tablet Take 500 mg by mouth 2 (two) times daily as needed.     No current facility-administered medications for this visit.     Physical Exam  Blood pressure 113/74, pulse 84, height 5\' 4"  (1.626 m), weight 98 lb (44.5 kg).  Constitutional: overall normal hygiene, normal nutrition, well developed, normal grooming,  normal body habitus. Assistive device:none  Musculoskeletal: gait and station Limp none, muscle tone and strength are normal, no tremors or atrophy is present.  .  Neurological: coordination overall normal.  Deep tendon reflex/nerve stretch intact.  Sensation normal.  Cranial nerves II-XII intact.   Skin:   Normal overall no scars, lesions, ulcers or rashes. No psoriasis.  Psychiatric: Alert and oriented x 3.  Recent memory intact, remote memory unclear.  Normal mood and affect. Well groomed.  Good eye contact.  Cardiovascular: overall no swelling, no varicosities, no edema bilaterally, normal temperatures of the legs and arms, no clubbing, cyanosis and good capillary refill.  Lymphatic: palpation  is normal.  Left Achilles is only slightly tender, full ROM, no limp.   All other systems reviewed and are negative   The patient has been educated about the nature of the problem(s) and counseled on treatment options.  The patient appeared to understand what I have discussed and is in agreement with it.  Encounter Diagnoses  Name Primary?  . Achilles tendinitis, left leg Yes  . Nicotine dependence, cigarettes, uncomplicated     PLAN Call if any problems.  Precautions discussed.  Continue current medications.   Return to clinic 1 month   Electronically Pack[place], MD 6/24/20218:12 AM

## 2019-10-02 NOTE — Patient Instructions (Addendum)
Steps to Quit Smoking Smoking tobacco is the leading cause of preventable death. It can affect almost every organ in the body. Smoking puts you and people around you at risk for many serious, long-lasting (chronic) diseases. Quitting smoking can be hard, but it is one of the best things that you can do for your health. It is never too late to quit. How do I get ready to quit? When you decide to quit smoking, make a plan to help you succeed. Before you quit:  Pick a date to quit. Set a date within the next 2 weeks to give you time to prepare.  Write down the reasons why you are quitting. Keep this list in places where you will see it often.  Tell your family, friends, and co-workers that you are quitting. Their support is important.  Talk with your doctor about the choices that may help you quit.  Find out if your health insurance will pay for these treatments.  Know the people, places, things, and activities that make you want to smoke (triggers). Avoid them. What first steps can I take to quit smoking?  Throw away all cigarettes at home, at work, and in your car.  Throw away the things that you use when you smoke, such as ashtrays and lighters.  Clean your car. Make sure to empty the ashtray.  Clean your home, including curtains and carpets. What can I do to help me quit smoking? Talk with your doctor about taking medicines and seeing a counselor at the same time. You are more likely to succeed when you do both.  If you are pregnant or breastfeeding, talk with your doctor about counseling or other ways to quit smoking. Do not take medicine to help you quit smoking unless your doctor tells you to do so. To quit smoking: Quit right away  Quit smoking totally, instead of slowly cutting back on how much you smoke over a period of time.  Go to counseling. You are more likely to quit if you go to counseling sessions regularly. Take medicine You may take medicines to help you quit. Some  medicines need a prescription, and some you can buy over-the-counter. Some medicines may contain a drug called nicotine to replace the nicotine in cigarettes. Medicines may:  Help you to stop having the desire to smoke (cravings).  Help to stop the problems that come when you stop smoking (withdrawal symptoms). Your doctor may ask you to use:  Nicotine patches, gum, or lozenges.  Nicotine inhalers or sprays.  Non-nicotine medicine that is taken by mouth. Find resources Find resources and other ways to help you quit smoking and remain smoke-free after you quit. These resources are most helpful when you use them often. They include:  Online chats with a counselor.  Phone quitlines.  Printed self-help materials.  Support groups or group counseling.  Text messaging programs.  Mobile phone apps. Use apps on your mobile phone or tablet that can help you stick to your quit plan. There are many free apps for mobile phones and tablets as well as websites. Examples include Quit Guide from the CDC and smokefree.gov  What things can I do to make it easier to quit?   Talk to your family and friends. Ask them to support and encourage you.  Call a phone quitline (1-800-QUIT-NOW), reach out to support groups, or work with a counselor.  Ask people who smoke to not smoke around you.  Avoid places that make you want to smoke,   such as: ? Bars. ? Parties. ? Smoke-break areas at work.  Spend time with people who do not smoke.  Lower the stress in your life. Stress can make you want to smoke. Try these things to help your stress: ? Getting regular exercise. ? Doing deep-breathing exercises. ? Doing yoga. ? Meditating. ? Doing a body scan. To do this, close your eyes, focus on one area of your body at a time from head to toe. Notice which parts of your body are tense. Try to relax the muscles in those areas. How will I feel when I quit smoking? Day 1 to 3 weeks Within the first 24 hours,  you may start to have some problems that come from quitting tobacco. These problems are very bad 2-3 days after you quit, but they do not often last for more than 2-3 weeks. You may get these symptoms:  Mood swings.  Feeling restless, nervous, angry, or annoyed.  Trouble concentrating.  Dizziness.  Strong desire for high-sugar foods and nicotine.  Weight gain.  Trouble pooping (constipation).  Feeling like you may vomit (nausea).  Coughing or a sore throat.  Changes in how the medicines that you take for other issues work in your body.  Depression.  Trouble sleeping (insomnia). Week 3 and afterward After the first 2-3 weeks of quitting, you may start to notice more positive results, such as:  Better sense of smell and taste.  Less coughing and sore throat.  Slower heart rate.  Lower blood pressure.  Clearer skin.  Better breathing.  Fewer sick days. Quitting smoking can be hard. Do not give up if you fail the first time. Some people need to try a few times before they succeed. Do your best to stick to your quit plan, and talk with your doctor if you have any questions or concerns. Summary  Smoking tobacco is the leading cause of preventable death. Quitting smoking can be hard, but it is one of the best things that you can do for your health.  When you decide to quit smoking, make a plan to help you succeed.  Quit smoking right away, not slowly over a period of time.  When you start quitting, seek help from your doctor, family, or friends. This information is not intended to replace advice given to you by your health care provider. Make sure you discuss any questions you have with your health care provider. Document Revised: 12/20/2018 Document Reviewed: 06/15/2018 Elsevier Patient Education  2020 [employer]   RETURN TO WORK October 13, 2019

## 2019-11-04 ENCOUNTER — Ambulatory Visit: Payer: BC Managed Care – PPO | Admitting: Orthopaedic Surgery

## 2020-01-05 IMAGING — CT CT ANGIO NECK
2 of 7 series · 8 of 33 positions shown · IV contrast (OMNI 350)
Comparison: Cervical MRI 08/02/2017

CLINICAL DATA: Possible arterial injury during C3-4 arthroplasty
yesterday

EXAM:
CT ANGIOGRAPHY NECK
TECHNIQUE: Multidetector CT imaging of the neck was performed using the
standard protocol during bolus administration of intravenous
contrast. Multiplanar CT image reconstructions and MIPs were
obtained to evaluate the vascular anatomy. Carotid stenosis
measurements (when applicable) are obtained utilizing NASCET
criteria, using the distal internal carotid diameter as the
denominator.
CONTRAST:  75mL GNY9X7-UOH IOPAMIDOL (GNY9X7-UOH) INJECTION 76%

[Series 5: cta neck · axial · 0.33mm/px · z∈[-237,-157]mm · 2 of 122 slices shown]
[im 41/122  soft-tissue]
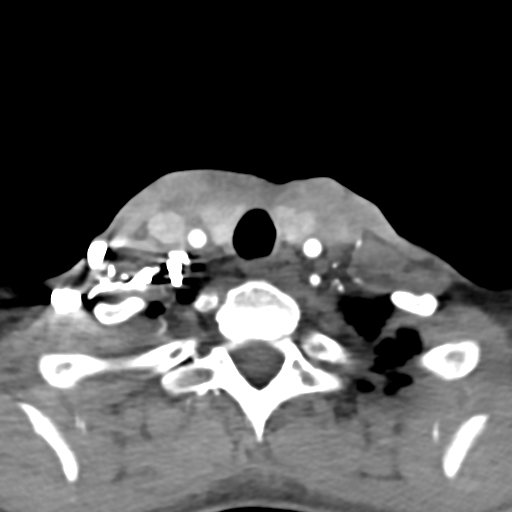
[im 81/122  soft-tissue]
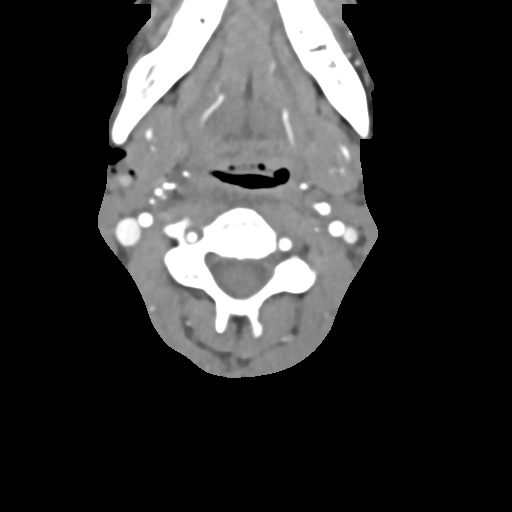

[Series 7: cta neck axial · axial · 0.29mm/px · z∈[-306,-137]mm · 6 of 243 slices shown]
[im 35/243  soft-tissue]
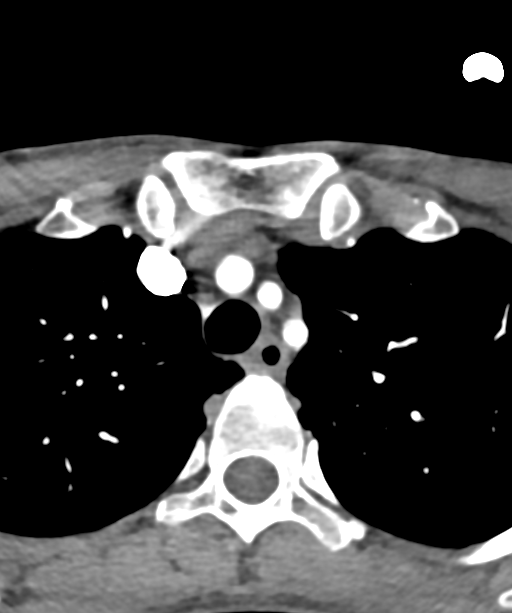
[im 70/243  bone]
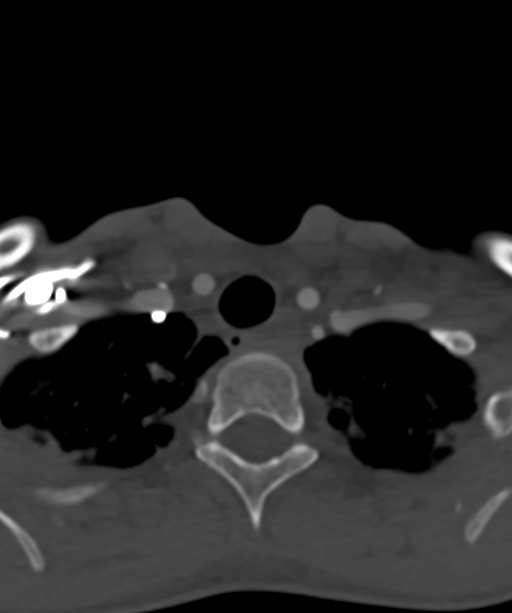
[im 104/243  soft-tissue]
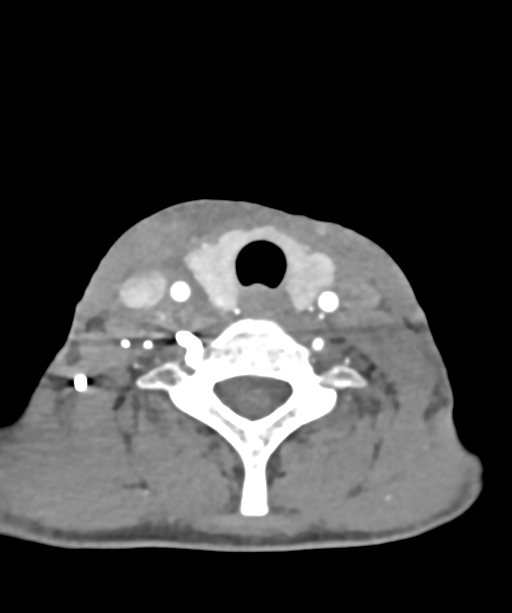
[im 139/243  bone]
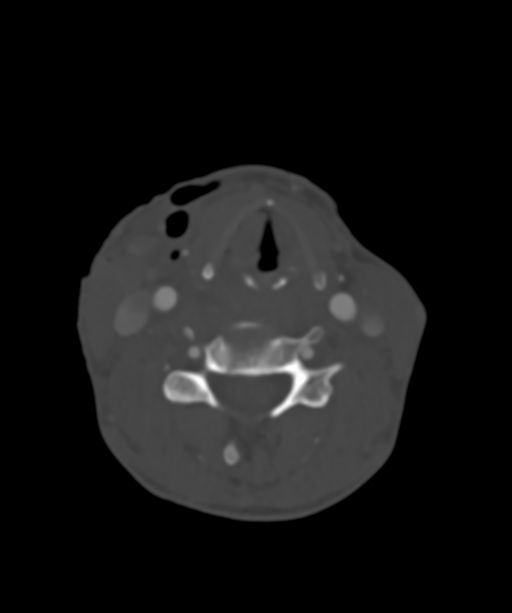
[im 173/243  soft-tissue]
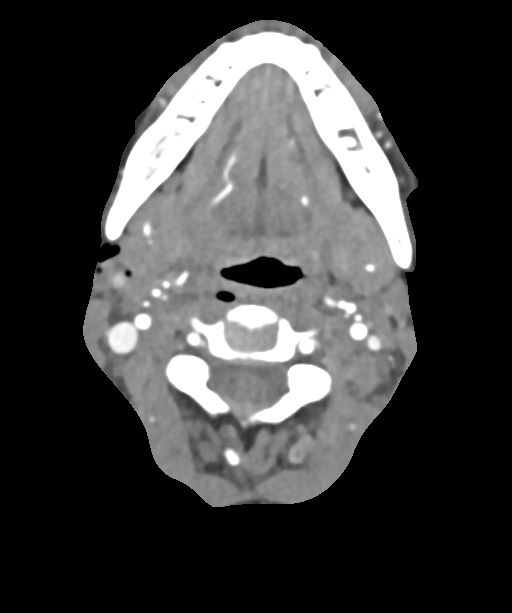
[im 208/243  bone]
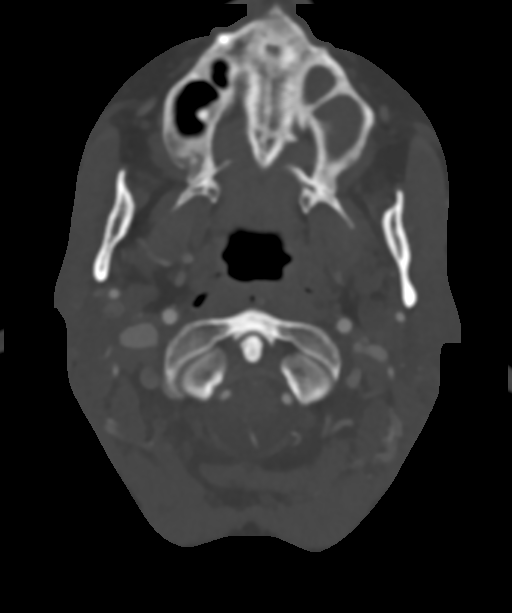

[8 of 33 positions shown; findings below may reference images not displayed]

FINDINGS: Aortic arch: Normal.  Three vessel branching

Right carotid system: Vessels are smooth and widely patent. No
atheromatous changes.

Left carotid system: Vessels are smooth and widely patent. No
atheromatous changes.

Vertebral arteries: No proximal subclavian stenosis or irregularity.
The vertebral arteries are smooth and widely patent. The proximal
right vertebral artery is obscured by streak from intravenous
contrast, but this is more proximal than the area of interest.

Skeleton: C3-4 disc arthroplasty. No associated fracture. Poor
dentition

Other neck: Soft tissue gas and swelling along the operative
approach in the right neck, expected. No discrete hematoma. No
airway narrowing. Left-sided sinusitis from obstructed middle
meatus, known from sinus CT 07/02/2017.

Upper chest: Mild airway thickening and mucoid impaction in the
right upper lobe in this smoker.
IMPRESSION: No evidence of vascular injury. Expected soft tissue findings after
C3-4 arthroplasty.

## 2020-11-14 ENCOUNTER — Inpatient Hospital Stay (HOSPITAL_COMMUNITY)
Admission: EM | Admit: 2020-11-14 | Discharge: 2020-11-18 | DRG: 202 | Disposition: A | Discharge status: Home / Self Care | Payer: BC Managed Care – PPO | Attending: Internal Medicine | Admitting: Internal Medicine

## 2020-11-14 ENCOUNTER — Emergency Department (HOSPITAL_COMMUNITY): Payer: BC Managed Care – PPO

## 2020-11-14 ENCOUNTER — Other Ambulatory Visit: Payer: Self-pay

## 2020-11-14 DIAGNOSIS — E440 Moderate protein-calorie malnutrition: Secondary | ICD-10-CM | POA: Diagnosis present

## 2020-11-14 DIAGNOSIS — J209 Acute bronchitis, unspecified: Secondary | ICD-10-CM | POA: Diagnosis not present

## 2020-11-14 DIAGNOSIS — Z8616 Personal history of COVID-19: Secondary | ICD-10-CM | POA: Diagnosis not present

## 2020-11-14 DIAGNOSIS — Z720 Tobacco use: Secondary | ICD-10-CM

## 2020-11-14 DIAGNOSIS — E876 Hypokalemia: Secondary | ICD-10-CM | POA: Diagnosis not present

## 2020-11-14 DIAGNOSIS — Z20822 Contact with and (suspected) exposure to covid-19: Secondary | ICD-10-CM | POA: Diagnosis present

## 2020-11-14 DIAGNOSIS — Z7951 Long term (current) use of inhaled steroids: Secondary | ICD-10-CM

## 2020-11-14 DIAGNOSIS — J441 Chronic obstructive pulmonary disease with (acute) exacerbation: Secondary | ICD-10-CM | POA: Diagnosis present

## 2020-11-14 DIAGNOSIS — J9601 Acute respiratory failure with hypoxia: Secondary | ICD-10-CM | POA: Diagnosis present

## 2020-11-14 DIAGNOSIS — Z681 Body mass index (BMI) 19 or less, adult: Secondary | ICD-10-CM

## 2020-11-14 DIAGNOSIS — R636 Underweight: Secondary | ICD-10-CM

## 2020-11-14 DIAGNOSIS — F419 Anxiety disorder, unspecified: Secondary | ICD-10-CM | POA: Diagnosis present

## 2020-11-14 DIAGNOSIS — F32A Depression, unspecified: Secondary | ICD-10-CM | POA: Diagnosis present

## 2020-11-14 DIAGNOSIS — F17210 Nicotine dependence, cigarettes, uncomplicated: Secondary | ICD-10-CM | POA: Diagnosis present

## 2020-11-14 DIAGNOSIS — Z886 Allergy status to analgesic agent status: Secondary | ICD-10-CM

## 2020-11-14 DIAGNOSIS — Z79899 Other long term (current) drug therapy: Secondary | ICD-10-CM

## 2020-11-14 LAB — BASIC METABOLIC PANEL
Anion gap: 9 (ref 5–15)
BUN: <5 mg/dL — ABNORMAL LOW (ref 6–20)
CO2: 28 mmol/L (ref 22–32)
Calcium: 9 mg/dL (ref 8.9–10.3)
Chloride: 103 mmol/L (ref 98–111)
Creatinine, Ser: 0.64 mg/dL (ref 0.44–1.00)
GFR, Estimated: >60 mL/min (ref >60)
Glucose, Bld: 94 mg/dL (ref 70–99)
Potassium: 3.1 mmol/L — ABNORMAL LOW (ref 3.5–5.1)
Sodium: 140 mmol/L (ref 135–145)

## 2020-11-14 LAB — CBC WITH DIFFERENTIAL/PLATELET
Abs Immature Granulocytes: 0.04 10*3/uL (ref 0.00–0.07)
Basophils Absolute: 0 10*3/uL (ref 0.0–0.1)
Basophils Relative: 0 %
Eosinophils Absolute: 0 10*3/uL (ref 0.0–0.5)
Eosinophils Relative: 0 %
HCT: 41.6 % (ref 36.0–46.0)
Hemoglobin: 14.1 g/dL (ref 12.0–15.0)
Immature Granulocytes: 0 %
Lymphocytes Relative: 31 %
Lymphs Abs: 3 10*3/uL (ref 0.7–4.0)
MCH: 32 pg (ref 26.0–34.0)
MCHC: 33.9 g/dL (ref 30.0–36.0)
MCV: 94.3 fL (ref 80.0–100.0)
Monocytes Absolute: 0.7 10*3/uL (ref 0.1–1.0)
Monocytes Relative: 7 %
Neutro Abs: 6 10*3/uL (ref 1.7–7.7)
Neutrophils Relative %: 62 %
Platelets: 240 10*3/uL (ref 150–400)
RBC: 4.41 MIL/uL (ref 3.87–5.11)
RDW: 13.1 % (ref 11.5–15.5)
WBC: 9.8 10*3/uL (ref 4.0–10.5)
nRBC: 0 % (ref 0.0–0.2)

## 2020-11-14 LAB — I-STAT BETA HCG BLOOD, ED (MC, WL, AP ONLY): I-stat hCG, quantitative: <5 m[IU]/mL (ref <5)

## 2020-11-14 LAB — RESP PANEL BY RT-PCR (FLU A&B, COVID) ARPGX2
Influenza A by PCR: NEGATIVE
Influenza B by PCR: NEGATIVE
SARS Coronavirus 2 by RT PCR: NEGATIVE

## 2020-11-14 MED ORDER — METHYLPREDNISOLONE SODIUM SUCC 40 MG IJ SOLR
40.0000 mg | Freq: Three times a day (TID) | INTRAMUSCULAR | Status: DC
  Administered 2020-11-15 – 2020-11-16 (×4): 40 mg via INTRAVENOUS
  Filled 2020-11-14 (×4): qty 1

## 2020-11-14 MED ORDER — SODIUM CHLORIDE 0.9 % IV SOLN
1.0000 g | INTRAVENOUS | Status: DC
  Administered 2020-11-14: 1 g via INTRAVENOUS
  Filled 2020-11-14 (×2): qty 10

## 2020-11-14 MED ORDER — IPRATROPIUM BROMIDE HFA 17 MCG/ACT IN AERS
2.0000 | INHALATION_SPRAY | Freq: Once | RESPIRATORY_TRACT | Status: AC
  Administered 2020-11-14: 2 via RESPIRATORY_TRACT
  Filled 2020-11-14: qty 12.9

## 2020-11-14 MED ORDER — POTASSIUM CHLORIDE 10 MEQ/100ML IV SOLN
10.0000 meq | Freq: Once | INTRAVENOUS | Status: AC
  Administered 2020-11-14: 10 meq via INTRAVENOUS
  Filled 2020-11-14: qty 100

## 2020-11-14 MED ORDER — SODIUM CHLORIDE 0.9% FLUSH
3.0000 mL | Freq: Two times a day (BID) | INTRAVENOUS | Status: DC
  Administered 2020-11-14 – 2020-11-18 (×8): 3 mL via INTRAVENOUS

## 2020-11-14 MED ORDER — POTASSIUM CHLORIDE CRYS ER 20 MEQ PO TBCR
40.0000 meq | EXTENDED_RELEASE_TABLET | Freq: Once | ORAL | Status: AC
  Administered 2020-11-14: 40 meq via ORAL
  Filled 2020-11-14: qty 2

## 2020-11-14 MED ORDER — MAGNESIUM SULFATE 2 GM/50ML IV SOLN
2.0000 g | Freq: Once | INTRAVENOUS | Status: AC
  Administered 2020-11-14: 2 g via INTRAVENOUS
  Filled 2020-11-14: qty 50

## 2020-11-14 MED ORDER — ALBUTEROL SULFATE (2.5 MG/3ML) 0.083% IN NEBU
2.5000 mg | INHALATION_SOLUTION | RESPIRATORY_TRACT | Status: DC | PRN
  Administered 2020-11-15 – 2020-11-17 (×2): 2.5 mg via RESPIRATORY_TRACT
  Filled 2020-11-14 (×2): qty 3

## 2020-11-14 MED ORDER — METHYLPREDNISOLONE SODIUM SUCC 125 MG IJ SOLR
125.0000 mg | Freq: Once | INTRAMUSCULAR | Status: AC
  Administered 2020-11-14: 125 mg via INTRAVENOUS
  Filled 2020-11-14: qty 2

## 2020-11-14 MED ORDER — ENOXAPARIN SODIUM 40 MG/0.4ML IJ SOSY
40.0000 mg | PREFILLED_SYRINGE | INTRAMUSCULAR | Status: DC
  Administered 2020-11-14 – 2020-11-17 (×4): 40 mg via SUBCUTANEOUS
  Filled 2020-11-14 (×4): qty 0.4

## 2020-11-14 MED ORDER — LORAZEPAM 2 MG/ML IJ SOLN
1.0000 mg | Freq: Once | INTRAMUSCULAR | Status: AC
  Administered 2020-11-14: 1 mg via INTRAVENOUS
  Filled 2020-11-14: qty 1

## 2020-11-14 MED ORDER — ONDANSETRON HCL 4 MG PO TABS: 4.0000 mg | ORAL_TABLET | Freq: Four times a day (QID) | ORAL | Status: DC | PRN

## 2020-11-14 MED ORDER — ALBUTEROL SULFATE HFA 108 (90 BASE) MCG/ACT IN AERS
6.0000 | INHALATION_SPRAY | Freq: Once | RESPIRATORY_TRACT | Status: AC
  Administered 2020-11-14: 6 via RESPIRATORY_TRACT
  Filled 2020-11-14: qty 6.7

## 2020-11-14 MED ORDER — IOHEXOL 350 MG/ML SOLN
100.0000 mL | Freq: Once | INTRAVENOUS | Status: AC | PRN
  Administered 2020-11-14: 100 mL via INTRAVENOUS

## 2020-11-14 MED ORDER — SODIUM CHLORIDE 0.9 % IV BOLUS
500.0000 mL | Freq: Once | INTRAVENOUS | Status: AC
  Administered 2020-11-14: 500 mL via INTRAVENOUS

## 2020-11-14 MED ORDER — ENSURE ENLIVE PO LIQD
237.0000 mL | Freq: Two times a day (BID) | ORAL | Status: DC
  Administered 2020-11-15 – 2020-11-18 (×7): 237 mL via ORAL

## 2020-11-14 MED ORDER — NICOTINE 21 MG/24HR TD PT24
21.0000 mg | MEDICATED_PATCH | Freq: Once | TRANSDERMAL | Status: DC
  Administered 2020-11-14: 21 mg via TRANSDERMAL
  Filled 2020-11-14: qty 1

## 2020-11-14 MED ORDER — ALBUTEROL SULFATE HFA 108 (90 BASE) MCG/ACT IN AERS
2.0000 | INHALATION_SPRAY | RESPIRATORY_TRACT | Status: DC | PRN
  Administered 2020-11-14: 2 via RESPIRATORY_TRACT
  Filled 2020-11-14: qty 6.7

## 2020-11-14 MED ORDER — GUAIFENESIN ER 600 MG PO TB12
600.0000 mg | ORAL_TABLET | Freq: Two times a day (BID) | ORAL | Status: DC
  Administered 2020-11-14 – 2020-11-18 (×9): 600 mg via ORAL
  Filled 2020-11-14 (×10): qty 1

## 2020-11-14 MED ORDER — IPRATROPIUM-ALBUTEROL 0.5-2.5 (3) MG/3ML IN SOLN
3.0000 mL | Freq: Four times a day (QID) | RESPIRATORY_TRACT | Status: DC
  Administered 2020-11-14 – 2020-11-17 (×11): 3 mL via RESPIRATORY_TRACT
  Filled 2020-11-14 (×11): qty 3

## 2020-11-14 MED ORDER — ACETAMINOPHEN 650 MG RE SUPP: 650.0000 mg | Freq: Four times a day (QID) | RECTAL | Status: DC | PRN

## 2020-11-14 MED ORDER — ACETAMINOPHEN 325 MG PO TABS
650.0000 mg | ORAL_TABLET | Freq: Four times a day (QID) | ORAL | Status: DC | PRN
  Administered 2020-11-15 – 2020-11-17 (×2): 650 mg via ORAL
  Filled 2020-11-14 (×2): qty 2

## 2020-11-14 MED ORDER — ARFORMOTEROL TARTRATE 15 MCG/2ML IN NEBU
15.0000 ug | INHALATION_SOLUTION | Freq: Two times a day (BID) | RESPIRATORY_TRACT | Status: DC
  Administered 2020-11-14 – 2020-11-18 (×8): 15 ug via RESPIRATORY_TRACT
  Filled 2020-11-14 (×8): qty 2

## 2020-11-14 MED ORDER — BUDESONIDE 0.5 MG/2ML IN SUSP
0.5000 mg | Freq: Two times a day (BID) | RESPIRATORY_TRACT | Status: DC
  Administered 2020-11-14 – 2020-11-18 (×8): 0.5 mg via RESPIRATORY_TRACT
  Filled 2020-11-14 (×9): qty 2

## 2020-11-14 MED ORDER — ONDANSETRON HCL 4 MG/2ML IJ SOLN: 4.0000 mg | Freq: Four times a day (QID) | INTRAMUSCULAR | Status: DC | PRN

## 2020-11-14 NOTE — ED Provider Notes (Signed)
[facility] EMERGENCY DEPARTMENT Provider Note   CSN: [id number] Arrival date & time: 11/14/20  0551     History Chief Complaint  Patient presents with   Shortness of Breath    Stacy Rogers is a 35 y.o. female.  The history is provided by the patient.  Shortness of Breath Severity:  Moderate Onset quality:  Gradual Duration:  5 days Timing:  Constant Progression:  Worsening Chronicity:  Recurrent Context comment:  Diagnosed with COVID about a month ago.  Last 5 days has been having shortness of breath, wheezing, coughing but not much relief from inhaler.  She states that she has been hypoxic at home especially with ambulation.  Does not wear oxygen at baseline. Relieved by:  Nothing Worsened by:  Exertion Associated symptoms: cough and wheezing   Associated symptoms: no abdominal pain, no chest pain, no ear pain, no fever, no rash, no sore throat and no vomiting       Past Medical History:  Diagnosis Date   Anxiety    Asthma    Depression     Patient Active Problem List   Diagnosis Date Noted   Loss of weight 02/19/2019   Nausea without vomiting 02/19/2019   Early satiety 02/19/2019   Poor appetite 02/19/2019   Cervical disc disorder with radiculopathy of cervical region 05/30/2018   Cervical radiculopathy 05/30/2018   Radiculopathy, cervical region 08/07/2017   Cervicalgia 06/19/2017   Depressed 12/28/2016   Asthma 10/27/2016   Current smoker 10/27/2016   Oral herpes 10/27/2016   GAD (generalized anxiety disorder) 10/27/2016   Underweight 10/27/2016    Past Surgical History:  Procedure Laterality Date   NECK SURGERY  05/2018     OB History   No obstetric history on file.     Family History  Problem Relation Age of Onset   COPD Mother    Alcohol abuse Father    Colon cancer Neg Hx     Social History   Tobacco Use   Smoking status: Every Day    Packs/day: 0.50    Years: 18.00    Pack years: 9.00    Types: Cigarettes    Smokeless tobacco: Never  Vaping Use   Vaping Use: Never used  Substance Use Topics   Alcohol use: No   Drug use: No    Home Medications Prior to Admission medications   Medication Sig Start Date End Date Taking? Authorizing Provider  albuterol (PROVENTIL HFA;VENTOLIN HFA) 108 (90 Base) MCG/ACT inhaler Inhale 2 puffs into the lungs 2 (two) times daily as needed for wheezing or shortness of breath. 10/27/16  Yes Hawks, Christy A, FNP  albuterol (PROVENTIL) (2.5 MG/3ML) 0.083% nebulizer solution Take 2.5 mg by nebulization every 6 (six) hours as needed for wheezing or shortness of breath.   Yes [provider]  diazepam (VALIUM) 5 MG tablet Take 5 mg by mouth 2 (two) times daily as needed for anxiety or sleep. 08/11/20  Yes [provider]  methocarbamol (ROBAXIN) 500 MG tablet Take 500 mg by mouth every 8 (eight) hours as needed for spasms. 01/20/19  Yes [provider]  montelukast (SINGULAIR) 10 MG tablet Take 1 tablet (10 mg total) by mouth at bedtime. Patient taking differently: Take 10 mg by mouth daily as needed (allergies.). 10/27/16  Yes Hawks, Christy A, FNP  rizatriptan (MAXALT) 10 MG tablet Take 10 mg by mouth as needed for migraine. May repeat in 2 hours if needed   Yes [provider]  SYMBICORT 160-4.5 MCG/ACT inhaler Inhale 2 puffs into the lungs 2 (two) times daily. 08/10/20  Yes [provider]  traZODone (DESYREL) 50 MG tablet Take 50 mg by mouth at bedtime.   Yes [provider]  valACYclovir (VALTREX) 500 MG tablet Take 500 mg by mouth 2 (two) times daily as needed (break outs). 01/03/19  Yes [provider]  venlafaxine XR (EFFEXOR-XR) 75 MG 24 hr capsule Take 75 mg by mouth daily. 05/10/20  Yes [provider]    Allergies    Aspirin, Aspirin, and Tramadol  Review of Systems   Review of Systems  Constitutional:  Negative for chills and fever.  HENT:  Negative for ear pain and sore throat.   Eyes:   Negative for pain and visual disturbance.  Respiratory:  Positive for cough, shortness of breath and wheezing.   Cardiovascular:  Negative for chest pain and palpitations.  Gastrointestinal:  Negative for abdominal pain and vomiting.  Genitourinary:  Negative for dysuria and hematuria.  Musculoskeletal:  Negative for arthralgias and back pain.  Skin:  Negative for color change and rash.  Neurological:  Negative for seizures and syncope.  All other systems reviewed and are negative.  Physical Exam Updated Vital Signs BP 97/65   Pulse 90   Temp 97.8 F (36.6 C) (Oral)   Resp 19   Ht 5\' 4"  (1.626 m)   Wt 45.4 kg   SpO2 92%   BMI 17.16 kg/m   Physical Exam Vitals and nursing note reviewed.  Constitutional:      General: She is not in acute distress.    Appearance: She is well-developed. She is not ill-appearing.  HENT:     Head: Normocephalic and atraumatic.     Mouth/Throat:     Mouth: Mucous membranes are moist.  Eyes:     Conjunctiva/sclera: Conjunctivae normal.     Pupils: Pupils are equal, round, and reactive to light.  Cardiovascular:     Rate and Rhythm: Regular rhythm. Tachycardia present.     Pulses: Normal pulses.     Heart sounds: Normal heart sounds. No murmur heard. Pulmonary:     Effort: Pulmonary effort is normal. Tachypnea present. No respiratory distress.     Breath sounds: Decreased breath sounds and wheezing present.  Abdominal:     Palpations: Abdomen is soft.     Tenderness: There is no abdominal tenderness.  Musculoskeletal:     Cervical back: Normal range of motion and neck supple.  Skin:    General: Skin is warm and dry.     Capillary Refill: Capillary refill takes less than 2 seconds.  Neurological:     General: No focal deficit present.     Mental Status: She is alert.  Psychiatric:        Mood and Affect: Mood normal.    ED Results / Procedures / Treatments   Labs (all labs ordered are listed, but only abnormal results are  displayed) Labs Reviewed  BASIC METABOLIC PANEL - Abnormal; Notable for the following components:      Result Value   Potassium 3.1 (*)    BUN <5 (*)    All other components within normal limits  RESP PANEL BY RT-PCR (FLU A&B, COVID) ARPGX2  CBC WITH DIFFERENTIAL/PLATELET  I-STAT BETA HCG BLOOD, ED ([facility], [facility], [facility] ONLY)    EKG EKG Interpretation  Date/Time:  Sunday November 14 2020 06:05:14 EDT Ventricular Rate:  112 PR Interval:  116 QRS Duration: 76 QT Interval:  304  QTC Calculation: 414 R Axis:   82 Text Interpretation: Sinus tachycardia Indeterminate axis Anterior infarct , age undetermined Abnormal ECG Confirmed by Virgina Norfolkuratolo, Parneet Glantz [phone]) on 11/14/2020 9:14:53 AM  Radiology DG Chest 2 View  Result Date: 11/14/2020 CLINICAL DATA:  35 year old female with history of cough and shortness of breath with exertion for the past few days. History of COVID infection 1 month ago. EXAM: CHEST - 2 VIEW COMPARISON:  Chest x-ray 01/07/2019. FINDINGS: Lung volumes are increased with emphysematous changes. Lung volumes are normal. No consolidative airspace disease. No pleural effusions. No pneumothorax. Density in the medial aspect of the apex of the right hemithorax likely to reflect an area of chronic post infectious or inflammatory scarring, better demonstrated on prior neck CT 07/12/2018. Additional areas of pleuroparenchymal thickening are noted in the lung apices bilaterally. Pulmonary vasculature and the cardiomediastinal silhouette are within normal limits. IMPRESSION: 1. No radiographic evidence of acute cardiopulmonary disease. 2. Emphysema. 3. 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 Electronically Signed   By: Trudie Reedaniel  Entrikin M.D.   On: 11/14/2020 06:38   CT Angio Chest PE W and/or Wo Contrast  Result Date: 11/14/2020 CLINICAL DATA:  PE rule out EXAM: CT ANGIOGRAPHY CHEST WITH CONTRAST TECHNIQUE: Multidetector CT imaging of the chest was  performed using the standard protocol during bolus administration of intravenous contrast. Multiplanar CT image reconstructions and MIPs were obtained to evaluate the vascular anatomy. CONTRAST:  100mL OMNIPAQUE IOHEXOL 350 MG/ML SOLN COMPARISON:  None. FINDINGS: Cardiovascular: Satisfactory opacification of the pulmonary arteries to the segmental level. No evidence of pulmonary embolism. Normal heart size. No pericardial effusion. Mediastinum/Nodes: No enlarged mediastinal, hilar, or axillary lymph nodes. Thyroid gland, trachea, and esophagus demonstrate no significant findings. Lungs/Pleura: Mild, predominantly paraseptal emphysema. Diffuse bilateral bronchial wall thickening and scattered bronchial plugging throughout no pleural effusion or pneumothorax. Upper Abdomen: No acute abnormality. Musculoskeletal: No chest wall abnormality. No acute or significant osseous findings. Review of the MIP images confirms the above findings. IMPRESSION: 1. Negative examination for pulmonary embolism. 2. Diffuse bilateral bronchial wall thickening and scattered bronchial plugging throughout, consistent with nonspecific infectious or inflammatory bronchitis. 3. Emphysema. Emphysema (ICD10-J43.9). Electronically Signed   By: Lauralyn PrimesAlex  Bibbey M.D.   On: 11/14/2020 12:32    Procedures .Critical Care  Date/Time: 11/14/2020 1:14 PM Performed by: Virgina Norfolkuratolo, Marlina Cataldi, DO Authorized by: Virgina Norfolkuratolo, Clydie Dillen, DO   Critical care provider statement:    Critical care time (minutes):  35   Critical care was necessary to treat or prevent imminent or life-threatening deterioration of the following conditions:  Respiratory failure   Critical care was time spent personally by me on the following activities:  Blood draw for specimens, development of treatment plan with patient or surrogate, discussions with primary provider, evaluation of patient's response to treatment, examination of patient, obtaining history from patient or surrogate, ordering and  performing treatments and interventions, ordering and review of laboratory studies, ordering and review of radiographic studies, re-evaluation of patient's condition, review of old charts and pulse oximetry   Care discussed with: admitting provider     Medications Ordered in ED Medications  albuterol (VENTOLIN HFA) 108 (90 Base) MCG/ACT inhaler 2 puff (2 puffs Inhalation Given 11/14/20 0600)  nicotine (NICODERM CQ - dosed in mg/24 hours) patch 21 mg (has no administration in time range)  LORazepam (ATIVAN) injection 1 mg (has no administration in time range)  methylPREDNISolone sodium succinate (SOLU-MEDROL) 125 mg/2 mL injection 125 mg (has no administration  in time range)  albuterol (VENTOLIN HFA) 108 (90 Base) MCG/ACT inhaler 6 puff (6 puffs Inhalation Given 11/14/20 1109)  ipratropium (ATROVENT HFA) inhaler 2 puff (2 puffs Inhalation Given 11/14/20 1109)  magnesium sulfate IVPB 2 g 50 mL (0 g Intravenous Stopped 11/14/20 1228)  sodium chloride 0.9 % bolus 500 mL (0 mLs Intravenous Stopped 11/14/20 1238)  potassium chloride 10 mEq in 100 mL IVPB (0 mEq Intravenous Stopped 11/14/20 1231)  potassium chloride SA (KLOR-CON) CR tablet 40 mEq (40 mEq Oral Given 11/14/20 1109)  iohexol (OMNIPAQUE) 350 MG/ML injection 100 mL (100 mLs Intravenous Contrast Given 11/14/20 1216)    ED Course  I have reviewed the triage vital signs and the nursing notes.  Pertinent labs & imaging results that were available during my care of the patient were reviewed by me and considered in my medical decision making (see chart for details).    MDM Rules/Calculators/A&P                           Louna Bezek is here with shortness of breath, cough.  Normal vitals except for tachycardia.  Oxygenation around 89 to 92% on room air upon my evaluation.  Has wheezing throughout with increased work of breathing.  History of asthma.  Diagnosed with COVID a month ago.  Denies blood clot history.  Overall suspect asthma exacerbation.   Chest x-ray with no obvious pneumonia.  Basic labs are otherwise unremarkable.  EKG shows sinus tachycardia.  She has been doing breathing treatments at home with not much relief.  She has been hypoxic she states at home especially with ambulation.  She is borderline hypoxic here and we will place her on 2 L of oxygen while giving her breathing treatment, steroids, magnesium, fluid bolus.  We will get a CT scan to further evaluate for pneumonia/PE especially given recent COVID.  Anticipate possible admission given work of breathing and borderline hypoxia.  CT scan negative for PE.  Concern for infectious versus inflammatory bronchitis.  She continues to be hypoxic on room air and will admit her for further care.  Given IV steroids.  Stable on 2 L of oxygen.  This chart was dictated using voice recognition software.  Despite best efforts to proofread,  errors can occur which can change the documentation meaning.  Final Clinical Impression(s) / ED Diagnoses Final diagnoses:  COPD exacerbation (HCC)  Acute respiratory failure with hypoxia [facility])    Rx / DC Orders ED Discharge Orders     None        Virgina Norfolk, DO 11/14/20 1315

## 2020-11-14 NOTE — H&P (Signed)
History and Physical    Karole Oo BOF:[id number] DOB: March 31, 1986 DOA: 11/14/2020  Referring MD/NP/PA: Virgina Norfolk, MD PCP: Benita Stabile, MD  Patient coming from: home  Chief Complaint: Shortness of breath  I have personally briefly reviewed patient's old medical records in [facility]   HPI: Stacy Rogers is a 35 y.o. female with medical history significant of asthma/COPD, anxiety, depression,  tobacco abuse, and COVID-19 infection 1 month ago presents with complaints of progressively worsening shortness of breath over the last 1 week.  Patient reports having subjective fever before onset of symptoms, but currently denies having fever.  She has had a intermittently productive cough with wheezing.  She has been unable to sleep at night due to coughing.  Any kind of activity seem to worsen symptoms.  Associated symptoms include leg pains and pain with coughing.  Denies having any nausea, vomiting, diarrhea, or chest pain.  She has been using her inhalers and had also taken 2 days worth of prednisone that she had leftover from her previous prescription without any relief in symptoms.  At baseline she does not wear oxygen, but reports that her oxygen dropped as low as 79% on room which prompted her to come to the hospital for further evaluation.  She still continues to smoke half pack cigarettes per day on average.  Patient reports that she lost about 5 pounds with her symptoms from COVID-19 and still does not have much of an appetite.  ED Course: Upon admission into the emergency department patient was noted to be afebrile with O2 saturations noted as low as 89% on room air for which patient had been placed on 2 L of nasal cannula oxygen.  Labs significant for potassium of 3.1.  Chest x-ray noted emphysematous changes without any acute abnormality.  CT angiogram of the chest was obtained and negative for signs of a pulmonary embolus, but did show concern for infectious/inflammatory  bronchitis.  Patient had been given albuterol inhaler, ipratropium inhaler, magnesium sulfate, and a total of 50 mEq of potassium chloride.  TRH called to admit.  Review of Systems  Constitutional:  Positive for weight loss. Negative for fever.  HENT:  Positive for congestion. Negative for nosebleeds.   Eyes:  Negative for photophobia and pain.  Respiratory:  Positive for cough, sputum production, shortness of breath and wheezing. Negative for hemoptysis.   Cardiovascular:  Negative for chest pain and leg swelling.  Gastrointestinal:  Negative for abdominal pain, nausea and vomiting.  Genitourinary:  Negative for dysuria and hematuria.  Musculoskeletal:  Negative for falls.  Skin:  Negative for rash.  Neurological:  Negative for focal weakness and loss of consciousness.  Psychiatric/Behavioral:  Positive for substance abuse. The patient has insomnia.    Past Medical History:  Diagnosis Date   Anxiety    Asthma    Depression     Past Surgical History:  Procedure Laterality Date   NECK SURGERY  05/2018     reports that she has been smoking cigarettes. She has a 9.00 pack-year smoking history. She has never used smokeless tobacco. She reports that she does not drink alcohol and does not use drugs.  Allergies  Allergen Reactions   Aspirin Swelling    Only to the lips   Aspirin Swelling    Blisters   Tramadol Nausea And Vomiting    headache    Family History  Problem Relation Age of Onset   COPD Mother    Alcohol abuse Father  Colon cancer Neg Hx     Prior to Admission medications   Medication Sig Start Date End Date Taking? Authorizing Provider  albuterol (PROVENTIL HFA;VENTOLIN HFA) 108 (90 Base) MCG/ACT inhaler Inhale 2 puffs into the lungs 2 (two) times daily as needed for wheezing or shortness of breath. 10/27/16  Yes Hawks, Christy A, FNP  albuterol (PROVENTIL) (2.5 MG/3ML) 0.083% nebulizer solution Take 2.5 mg by nebulization every 6 (six) hours as needed for  wheezing or shortness of breath.   Yes [provider]  diazepam (VALIUM) 5 MG tablet Take 5 mg by mouth 2 (two) times daily as needed for anxiety or sleep. 08/11/20  Yes [provider]  methocarbamol (ROBAXIN) 500 MG tablet Take 500 mg by mouth every 8 (eight) hours as needed for spasms. 01/20/19  Yes [provider]  montelukast (SINGULAIR) 10 MG tablet Take 1 tablet (10 mg total) by mouth at bedtime. Patient taking differently: Take 10 mg by mouth daily as needed (allergies.). 10/27/16  Yes Hawks, Christy A, FNP  rizatriptan (MAXALT) 10 MG tablet Take 10 mg by mouth as needed for migraine. May repeat in 2 hours if needed   Yes [provider]  SYMBICORT 160-4.5 MCG/ACT inhaler Inhale 2 puffs into the lungs 2 (two) times daily. 08/10/20  Yes [provider]  traZODone (DESYREL) 50 MG tablet Take 50 mg by mouth at bedtime.   Yes [provider]  valACYclovir (VALTREX) 500 MG tablet Take 500 mg by mouth 2 (two) times daily as needed (break outs). 01/03/19  Yes [provider]  venlafaxine XR (EFFEXOR-XR) 75 MG 24 hr capsule Take 75 mg by mouth daily. 05/10/20  Yes [provider]    Physical Exam:  Constitutional: Thin middle-aged female Vitals:   11/14/20 0558 11/14/20 0759 11/14/20 1142 11/14/20 1145  BP:  97/65    Pulse:  94  90  Resp:  16  19  Temp:  98.7 F (37.1 C) 97.8 F (36.6 C)   TempSrc:   Oral   SpO2:  94%  92%  Weight: 45.4 kg     Height: 5\' 4"  (1.626 m)      Eyes: PERRL, lids and conjunctivae normal ENMT: Mucous membranes are moist. Posterior pharynx clear of any exudate or lesions.  Neck: normal, supple, no masses, no thyromegaly Respiratory: Diffuse expiratory wheezes and rhonchi in both lung fields.  Patient currently on 2 L of nasal cannula oxygen with O2 saturation maintained Cardiovascular: Regular rate and rhythm, no murmurs / rubs / gallops. No extremity edema. 2+ pedal pulses. No carotid bruits.   Abdomen: no tenderness, no masses palpated. No hepatosplenomegaly. Bowel sounds positive.  Musculoskeletal: no clubbing / cyanosis.  No significant deformity appreciated.  Normal range of motion. Skin: no rashes, lesions, ulcers. No induration Neurologic: CN 2-12 grossly intact. Sensation intact, DTR normal. Strength 5/5 in all 4.  Psychiatric: Normal judgment and insight. Alert and oriented x 3. Normal mood.     Labs on Admission: I have personally reviewed following labs and imaging studies  CBC: Recent Labs  Lab 11/14/20 0605  WBC 9.8  NEUTROABS 6.0  HGB 14.1  HCT 41.6  MCV 94.3  PLT 240   Basic Metabolic Panel: Recent Labs  Lab 11/14/20 0605  NA 140  K 3.1*  CL 103  CO2 28  GLUCOSE 94  BUN <5*  CREATININE 0.64  CALCIUM 9.0   GFR: Estimated Creatinine Clearance: 69 mL/min (by C-G formula based on SCr of 0.64 mg/dL).  Liver Function Tests: No results for input(s): AST, ALT, ALKPHOS, BILITOT, PROT, ALBUMIN in the last 168 hours. No results for input(s): LIPASE, AMYLASE in the last 168 hours. No results for input(s): AMMONIA in the last 168 hours. Coagulation Profile: No results for input(s): INR, PROTIME in the last 168 hours. Cardiac Enzymes: No results for input(s): CKTOTAL, CKMB, CKMBINDEX, TROPONINI in the last 168 hours. BNP (last 3 results) No results for input(s): PROBNP in the last 8760 hours. HbA1C: No results for input(s): HGBA1C in the last 72 hours. CBG: No results for input(s): GLUCAP in the last 168 hours. Lipid Profile: No results for input(s): CHOL, HDL, LDLCALC, TRIG, CHOLHDL, LDLDIRECT in the last 72 hours. Thyroid Function Tests: No results for input(s): TSH, T4TOTAL, FREET4, T3FREE, THYROIDAB in the last 72 hours. Anemia Panel: No results for input(s): VITAMINB12, FOLATE, FERRITIN, TIBC, IRON, RETICCTPCT in the last 72 hours. Urine analysis:    Component Value Date/Time   COLORURINE YELLOW 01/30/2009 1118   APPEARANCEUR CLEAR  01/30/2009 1118   LABSPEC 1.015 01/30/2009 1118   PHURINE 6.0 01/30/2009 1118   GLUCOSEU NEGATIVE 01/30/2009 1118   HGBUR TRACE (A) 01/30/2009 1118   BILIRUBINUR NEGATIVE 01/30/2009 1118   KETONESUR NEGATIVE 01/30/2009 1118   PROTEINUR NEGATIVE 01/30/2009 1118   UROBILINOGEN 0.2 01/30/2009 1118   NITRITE NEGATIVE 01/30/2009 1118   LEUKOCYTESUR NEGATIVE 01/30/2009 1118   Sepsis Labs: Recent Results (from the past 240 hour(s))  Resp Panel by RT-PCR (Flu A&B, Covid) Nasopharyngeal Swab     Status: None   Collection Time: 11/14/20  9:27 AM   Specimen: Nasopharyngeal Swab; Nasopharyngeal(NP) swabs in vial transport medium  Result Value Ref Range Status   SARS Coronavirus 2 by RT PCR NEGATIVE NEGATIVE Final    Comment: (NOTE) SARS-CoV-2 target nucleic acids are NOT DETECTED.  The SARS-CoV-2 RNA is generally detectable in upper respiratory specimens during the acute phase of infection. The lowest concentration of SARS-CoV-2 viral copies this assay can detect is 138 copies/mL. A negative result does not preclude SARS-Cov-2 infection and should not be used as the sole basis for treatment or other patient management decisions. A negative result may occur with  improper specimen collection/handling, submission of specimen other than nasopharyngeal swab, presence of viral mutation(s) within the areas targeted by this assay, and inadequate number of viral copies(<138 copies/mL). A negative result must be combined with clinical observations, patient history, and epidemiological information. The expected result is Negative.  Fact Sheet for Patients:  BloggerCourse.com  Fact Sheet for Healthcare Providers:  SeriousBroker.it  This test is no t yet approved or cleared by the [place] FDA and  has been authorized for detection and/or diagnosis of SARS-CoV-2 by FDA under an Emergency Use Authorization (EUA). This EUA will remain  in effect  (meaning this test can be used) for the duration of the COVID-19 declaration under Section 564(b)(1) of the Act, 21 U.S.C.section 360bbb-3(b)(1), unless the authorization is terminated  or revoked sooner.       Influenza A by PCR NEGATIVE NEGATIVE Final   Influenza B by PCR NEGATIVE NEGATIVE Final    Comment: (NOTE) The Xpert Xpress SARS-CoV-2/FLU/RSV plus assay is intended as an aid in the diagnosis of influenza from Nasopharyngeal swab specimens and should not be used as a sole basis for treatment. Nasal washings and aspirates are unacceptable for Xpert Xpress SARS-CoV-2/FLU/RSV testing.  Fact Sheet for Patients: BloggerCourse.com  Fact Sheet for Healthcare Providers: SeriousBroker.it  This test is not yet approved or cleared by  the [place] FDA and has been authorized for detection and/or diagnosis of SARS-CoV-2 by FDA under an Emergency Use Authorization (EUA). This EUA will remain in effect (meaning this test can be used) for the duration of the COVID-19 declaration under Section 564(b)(1) of the Act, 21 U.S.C. section 360bbb-3(b)(1), unless the authorization is terminated or revoked.  Performed at [facility], [address] [place], [place] [address]      Radiological Exams on Admission: DG Chest 2 View  Result Date: 11/14/2020 CLINICAL DATA:  35 year old female with history of cough and shortness of breath with exertion for the past few days. History of COVID infection 1 month ago. EXAM: CHEST - 2 VIEW COMPARISON:  Chest x-ray 01/07/2019. FINDINGS: Lung volumes are increased with emphysematous changes. Lung volumes are normal. No consolidative airspace disease. No pleural effusions. No pneumothorax. Density in the medial aspect of the apex of the right hemithorax likely to reflect an area of chronic post infectious or inflammatory scarring, better demonstrated on prior neck CT 07/12/2018. Additional areas of  pleuroparenchymal thickening are noted in the lung apices bilaterally. Pulmonary vasculature and the cardiomediastinal silhouette are within normal limits. IMPRESSION: 1. No radiographic evidence of acute cardiopulmonary disease. 2. Emphysema. 3. 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 Electronically Signed   By: Trudie Reedaniel  Entrikin M.D.   On: 11/14/2020 06:38   CT Angio Chest PE W and/or Wo Contrast  Result Date: 11/14/2020 CLINICAL DATA:  PE rule out EXAM: CT ANGIOGRAPHY CHEST WITH CONTRAST TECHNIQUE: Multidetector CT imaging of the chest was performed using the standard protocol during bolus administration of intravenous contrast. Multiplanar CT image reconstructions and MIPs were obtained to evaluate the vascular anatomy. CONTRAST:  100mL OMNIPAQUE IOHEXOL 350 MG/ML SOLN COMPARISON:  None. FINDINGS: Cardiovascular: Satisfactory opacification of the pulmonary arteries to the segmental level. No evidence of pulmonary embolism. Normal heart size. No pericardial effusion. Mediastinum/Nodes: No enlarged mediastinal, hilar, or axillary lymph nodes. Thyroid gland, trachea, and esophagus demonstrate no significant findings. Lungs/Pleura: Mild, predominantly paraseptal emphysema. Diffuse bilateral bronchial wall thickening and scattered bronchial plugging throughout no pleural effusion or pneumothorax. Upper Abdomen: No acute abnormality. Musculoskeletal: No chest wall abnormality. No acute or significant osseous findings. Review of the MIP images confirms the above findings. IMPRESSION: 1. Negative examination for pulmonary embolism. 2. Diffuse bilateral bronchial wall thickening and scattered bronchial plugging throughout, consistent with nonspecific infectious or inflammatory bronchitis. 3. Emphysema. Emphysema (ICD10-J43.9). Electronically Signed   By: Lauralyn PrimesAlex  Bibbey M.D.   On: 11/14/2020 12:32    EKG: Independently reviewed.  Sinus tachycardia 112  bpm  Assessment/Plan Acute respiratory failure with hypoxia secondary to bronchitis/COPD exacerbation: Patient presents with a week of progressive worsening shortness of breath, cough, and wheezing.  Reported O2 saturations as low as 79% at home.  Currently on 2 L of nasal cannula oxygen, but no prior history of requiring oxygen in the past.  CTA negative for pulmonary embolus, but did note for infectious or inflammatory bronchitis.  Patient had been given IV steroids, magnesium, and started on inhalers. -Admit to a MedSurg bed -Continuous pulse oximetry with nasal cannula oxygen to maintain O2 saturation greater than 92% -Check pulse oximetry with ambulation tomorrow -DuoNebs 4 times daily -Brovana and budesonide nebs twice daily substitution for Symbicort -Solu-Medrol IV 40 mg every 8 hours -Empiric antibiotics of Rocephin IV -Mucinex  Hypokalemia: Acute.  Potassium noted to be as low as 3.1 admission.  Patient has been given  a total of 50 mEq of potassium chloride while in the ED. -Continue to monitor and replace as needed  Tobacco abuse: Patient reports smoking half pack cigarettes per day on average -Continue nicotine patch  History of COVID-19 infection: Patient just recently had gotten over COVID-19 one month ago.  Underweight: BMI 17.16 kg/m.  Patient reports still having COVID-19. -Ensure shakes in between meals  DVT prophylaxis: Lovenox Code Status: full Family Communication: Significant other Disposition Plan: Discharge home in 1 to 2 days Consults called: none Admission status: Observation  Clydie Braun MD [facility]   If 7PM-7AM, please contact night-coverage   11/14/2020, 1:30 PM

## 2020-11-14 NOTE — ED Provider Notes (Signed)
Emergency Medicine Provider Triage Evaluation Note  Stacy Rogers , a 35 y.o. female  was evaluated in triage.  Pt complains of SOB.  States that she had COVID a month ago.  Reports gradually worsening SOB over the last few days.  She reports associated cough.  Denies any new fevers.  Has had no improvement with home nebs and inhlaers.  Review of Systems  Positive: Cough, sob Negative: Fever, chills  Physical Exam  There were no vitals taken for this visit. Gen:   Awake, no distress   Resp:  Tachypneic, coughing MSK:   Moves extremities without difficulty  Other:    Medical Decision Making  Medically screening exam initiated at 5:57 AM.  Appropriate orders placed.  Stacy Rogers was informed that the remainder of the evaluation will be completed by another provider, this initial triage assessment does not replace that evaluation, and the importance of remaining in the ED until their evaluation is complete.  SOB   Roxy Horseman, PA-C 11/14/20 0558    Nira Conn, MD 11/14/20 [phone]

## 2020-11-14 NOTE — ED Triage Notes (Signed)
Pt Reported to ED with c/o SOB that increased with exertion x past few days. Pt states she had COVID approximately one month ago.

## 2020-11-15 DIAGNOSIS — E876 Hypokalemia: Secondary | ICD-10-CM | POA: Diagnosis present

## 2020-11-15 DIAGNOSIS — Z8616 Personal history of COVID-19: Secondary | ICD-10-CM | POA: Diagnosis not present

## 2020-11-15 DIAGNOSIS — F419 Anxiety disorder, unspecified: Secondary | ICD-10-CM | POA: Diagnosis present

## 2020-11-15 DIAGNOSIS — Z886 Allergy status to analgesic agent status: Secondary | ICD-10-CM | POA: Diagnosis not present

## 2020-11-15 DIAGNOSIS — Z681 Body mass index (BMI) 19 or less, adult: Secondary | ICD-10-CM | POA: Diagnosis not present

## 2020-11-15 DIAGNOSIS — J9601 Acute respiratory failure with hypoxia: Secondary | ICD-10-CM | POA: Diagnosis present

## 2020-11-15 DIAGNOSIS — J209 Acute bronchitis, unspecified: Secondary | ICD-10-CM | POA: Diagnosis present

## 2020-11-15 DIAGNOSIS — J441 Chronic obstructive pulmonary disease with (acute) exacerbation: Secondary | ICD-10-CM | POA: Diagnosis present

## 2020-11-15 DIAGNOSIS — F32A Depression, unspecified: Secondary | ICD-10-CM | POA: Diagnosis present

## 2020-11-15 DIAGNOSIS — E440 Moderate protein-calorie malnutrition: Secondary | ICD-10-CM | POA: Diagnosis present

## 2020-11-15 DIAGNOSIS — F17210 Nicotine dependence, cigarettes, uncomplicated: Secondary | ICD-10-CM | POA: Diagnosis present

## 2020-11-15 DIAGNOSIS — Z79899 Other long term (current) drug therapy: Secondary | ICD-10-CM | POA: Diagnosis not present

## 2020-11-15 DIAGNOSIS — Z20822 Contact with and (suspected) exposure to covid-19: Secondary | ICD-10-CM | POA: Diagnosis present

## 2020-11-15 DIAGNOSIS — Z7951 Long term (current) use of inhaled steroids: Secondary | ICD-10-CM | POA: Diagnosis not present

## 2020-11-15 LAB — CBC
HCT: 39.4 % (ref 36.0–46.0)
Hemoglobin: 12.9 g/dL (ref 12.0–15.0)
MCH: 31.2 pg (ref 26.0–34.0)
MCHC: 32.7 g/dL (ref 30.0–36.0)
MCV: 95.2 fL (ref 80.0–100.0)
Platelets: 255 10*3/uL (ref 150–400)
RBC: 4.14 MIL/uL (ref 3.87–5.11)
RDW: 13 % (ref 11.5–15.5)
WBC: 9.2 10*3/uL (ref 4.0–10.5)
nRBC: 0 % (ref 0.0–0.2)

## 2020-11-15 LAB — HIV ANTIBODY (ROUTINE TESTING W REFLEX): HIV Screen 4th Generation wRfx: NONREACTIVE

## 2020-11-15 LAB — COMPREHENSIVE METABOLIC PANEL
ALT: 13 U/L (ref 0–44)
AST: 13 U/L — ABNORMAL LOW (ref 15–41)
Albumin: 3.2 g/dL — ABNORMAL LOW (ref 3.5–5.0)
Alkaline Phosphatase: 32 U/L — ABNORMAL LOW (ref 38–126)
Anion gap: 7 (ref 5–15)
BUN: 5 mg/dL — ABNORMAL LOW (ref 6–20)
CO2: 23 mmol/L (ref 22–32)
Calcium: 8.7 mg/dL — ABNORMAL LOW (ref 8.9–10.3)
Chloride: 106 mmol/L (ref 98–111)
Creatinine, Ser: 0.47 mg/dL (ref 0.44–1.00)
GFR, Estimated: >60 mL/min (ref >60)
Glucose, Bld: 89 mg/dL (ref 70–99)
Potassium: 4.2 mmol/L (ref 3.5–5.1)
Sodium: 136 mmol/L (ref 135–145)
Total Bilirubin: 0.3 mg/dL (ref 0.3–1.2)
Total Protein: 6.1 g/dL — ABNORMAL LOW (ref 6.5–8.1)

## 2020-11-15 LAB — BRAIN NATRIURETIC PEPTIDE: B Natriuretic Peptide: 81.3 pg/mL (ref 0.0–100.0)

## 2020-11-15 MED ORDER — NICOTINE 21 MG/24HR TD PT24
21.0000 mg | MEDICATED_PATCH | Freq: Every day | TRANSDERMAL | Status: DC
  Administered 2020-11-15 – 2020-11-18 (×4): 21 mg via TRANSDERMAL
  Filled 2020-11-15 (×4): qty 1

## 2020-11-15 MED ORDER — LEVOFLOXACIN 500 MG PO TABS
500.0000 mg | ORAL_TABLET | Freq: Every day | ORAL | Status: AC
  Administered 2020-11-15 – 2020-11-17 (×3): 500 mg via ORAL
  Filled 2020-11-15 (×3): qty 1

## 2020-11-15 MED ORDER — ALPRAZOLAM 0.5 MG PO TABS
0.5000 mg | ORAL_TABLET | Freq: Two times a day (BID) | ORAL | Status: DC | PRN
  Administered 2020-11-15 – 2020-11-17 (×2): 0.5 mg via ORAL
  Filled 2020-11-15 (×2): qty 1

## 2020-11-15 NOTE — ED Notes (Addendum)
Pt's husband back at bedside 

## 2020-11-15 NOTE — Progress Notes (Signed)
PROGRESS NOTE                                                                                                                                                                                                             Patient Demographics:    Stacy Rogers, is a 35 y.o. female, DOB - 1985/04/30, KVQ:[id number]  Outpatient Primary MD for the patient is Benita Stabile, MD    LOS - 0  Admit date - 11/14/2020    Chief Complaint  Patient presents with   Shortness of Breath       Brief Narrative (HPI from H&P) - Stacy Rogers is a 35 y.o. female with medical history significant of asthma/COPD, anxiety, depression,  tobacco abuse, and COVID-19 infection 1 month ago presents with complaints of progressively worsening shortness of breath over the last 1 week.  He was diagnosed with COPD exacerbation and admitted to the hospital.   Subjective:    Stacy Rogers today has, No headache, No chest pain, No abdominal pain - No Nausea, No new weakness tingling or numbness, +ve SOB and wheezing.   Assessment  & Plan :     Acute Hypoxic Resp. Failure due to Acute on chronic COPD exacerbation along with atypical bacterial infection induced acute bronchitis - she is currently on IV steroids, oxygen supplementation, will place her on Levaquin for 3 days, encouraged to sit up in chair in the daytime use I-S and flutter valve for pulmonary toiletry.  Will advance activity and titrate down oxygen as possible.  SpO2: 93 % O2 Flow Rate (L/min): 4 L/min   2.  Ongoing smoking.  Counseled to quit.  3.  Hypokalemia.  Replaced.  4.  Moderate protein calorie malnutrition BMI 17.  On protein shakes.      Condition - Fair  Family Communication  :  None present  Code Status :  Full  Consults  :  None  PUD Prophylaxis :    Procedures  :      CT - Bronchitis      Disposition Plan  :    Status is: Observation    Dispo: The  patient is from: Home              Anticipated d/c is to: Home  Patient currently is not medically stable to d/c.   Difficult to place patient No  DVT Prophylaxis  :    enoxaparin (LOVENOX) injection 40 mg Start: 11/14/20 1600    Lab Results  Component Value Date   PLT 255 11/15/2020    Diet :  Diet Order             Diet regular Room service appropriate? Yes; Fluid consistency: Thin  Diet effective now                    Inpatient Medications  Scheduled Meds:  arformoterol  15 mcg Nebulization BID   budesonide (PULMICORT) nebulizer solution  0.5 mg Nebulization BID   enoxaparin (LOVENOX) injection  40 mg Subcutaneous Q24H   feeding supplement  237 mL Oral BID BM   guaiFENesin  600 mg Oral BID   ipratropium-albuterol  3 mL Nebulization QID   levofloxacin  500 mg Oral Daily   methylPREDNISolone (SOLU-MEDROL) injection  40 mg Intravenous Q8H   nicotine  21 mg Transdermal Once   sodium chloride flush  3 mL Intravenous Q12H   Continuous Infusions: PRN Meds:.acetaminophen **OR** [DISCONTINUED] acetaminophen, albuterol, [DISCONTINUED] ondansetron **OR** ondansetron (ZOFRAN) IV  Antibiotics  :    Anti-infectives (From admission, onward)    Start     Dose/Rate Route Frequency Ordered Stop   11/15/20 1115  levofloxacin (LEVAQUIN) tablet 500 mg        500 mg Oral Daily 11/15/20 1025 11/18/20 0959   11/14/20 1545  cefTRIAXone (ROCEPHIN) 1 g in sodium chloride 0.9 % 100 mL IVPB  Status:  Discontinued        1 g 200 mL/hr over 30 Minutes Intravenous Every 24 hours 11/14/20 1530 11/15/20 1025        Time Spent in minutes  30   Susa Raring M.D on 11/15/2020 at 10:26 AM  To page go to [email or web address]   [facility] -  Office  [phone]    See all Orders from today for further details    Objective:   Vitals:   11/15/20 0802 11/15/20 0845 11/15/20 0849 11/15/20 0922  BP: 100/73   105/67  Pulse: (!) 102 88 80 95  Resp: (!) 32 17 19 19    Temp: 98.1 F (36.7 C)   98.4 F (36.9 C)  TempSrc:    Oral  SpO2: 90% 91% 95% 93%  Weight:      Height:        Wt Readings from Last 3 Encounters:  11/14/20 45.4 kg  10/02/19 44.5 kg  09/04/19 44.7 kg     Intake/Output Summary (Last 24 hours) at 11/15/2020 1026 Last data filed at 11/14/2020 1238 Gross per 24 hour  Intake 641.7 ml  Output --  Net 641.7 ml     Physical Exam  Awake Alert, No new F.N deficits, Normal affect [place].AT,PERRAL Supple Neck,No JVD, No cervical lymphadenopathy appriciated.  Symmetrical Chest wall movement, Good air movement bilaterally, +ve wheezing RRR,No Gallops,Rubs or new Murmurs, No Parasternal Heave +ve B.Sounds, Abd Soft, No tenderness, No organomegaly appriciated, No rebound - guarding or rigidity. No Cyanosis, Clubbing or edema, No new Rash or bruise      Data Review:    CBC Recent Labs  Lab 11/14/20 0605 11/15/20 0554  WBC 9.8 9.2  HGB 14.1 12.9  HCT 41.6 39.4  PLT 240 255  MCV 94.3 95.2  MCH 32.0 31.2  MCHC 33.9 32.7  RDW 13.1 13.0  LYMPHSABS 3.0  --  MONOABS 0.7  --   EOSABS 0.0  --   BASOSABS 0.0  --     Recent Labs  Lab 11/14/20 0605 11/15/20 0554 11/15/20 0809  NA 140 136  --   K 3.1* 4.2  --   CL 103 106  --   CO2 28 23  --   GLUCOSE 94 89  --   BUN <5* 5*  --   CREATININE 0.64 0.47  --   CALCIUM 9.0 8.7*  --   AST  --  13*  --   ALT  --  13  --   ALKPHOS  --  32*  --   BILITOT  --  0.3  --   ALBUMIN  --  3.2*  --   BNP  --   --  81.3    ------------------------------------------------------------------------------------------------------------------ No results for input(s): CHOL, HDL, LDLCALC, TRIG, CHOLHDL, LDLDIRECT in the last 72 hours.  No results found for: HGBA1C ------------------------------------------------------------------------------------------------------------------ No results for input(s): TSH, T4TOTAL, T3FREE, THYROIDAB in the last 72 hours.  Invalid input(s): FREET3  Cardiac  Enzymes No results for input(s): CKMB, TROPONINI, MYOGLOBIN in the last 168 hours.  Invalid input(s): CK ------------------------------------------------------------------------------------------------------------------    Component Value Date/Time   BNP 81.3 11/15/2020 0809      Radiology Reports DG Chest 2 View  Result Date: 11/14/2020 CLINICAL DATA:  35 year old female with history of cough and shortness of breath with exertion for the past few days. History of COVID infection 1 month ago. EXAM: CHEST - 2 VIEW COMPARISON:  Chest x-ray 01/07/2019. FINDINGS: Lung volumes are increased with emphysematous changes. Lung volumes are normal. No consolidative airspace disease. No pleural effusions. No pneumothorax. Density in the medial aspect of the apex of the right hemithorax likely to reflect an area of chronic post infectious or inflammatory scarring, better demonstrated on prior neck CT 07/12/2018. Additional areas of pleuroparenchymal thickening are noted in the lung apices bilaterally. Pulmonary vasculature and the cardiomediastinal silhouette are within normal limits. IMPRESSION: 1. No radiographic evidence of acute cardiopulmonary disease. 2. Emphysema. 3. 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 Electronically Signed   By: Trudie Reed M.D.   On: 11/14/2020 06:38   CT Angio Chest PE W and/or Wo Contrast  Result Date: 11/14/2020 CLINICAL DATA:  PE rule out EXAM: CT ANGIOGRAPHY CHEST WITH CONTRAST TECHNIQUE: Multidetector CT imaging of the chest was performed using the standard protocol during bolus administration of intravenous contrast. Multiplanar CT image reconstructions and MIPs were obtained to evaluate the vascular anatomy. CONTRAST:  OMNIPAQUE IOHEXOL 350 MG/ML SOLN COMPARISON:  None. FINDINGS: Cardiovascular: Satisfactory opacification of the pulmonary arteries to the segmental level. No evidence of pulmonary embolism.  Normal heart size. No pericardial effusion. Mediastinum/Nodes: No enlarged mediastinal, hilar, or axillary lymph nodes. Thyroid gland, trachea, and esophagus demonstrate no significant findings. Lungs/Pleura: Mild, predominantly paraseptal emphysema. Diffuse bilateral bronchial wall thickening and scattered bronchial plugging throughout no pleural effusion or pneumothorax. Upper Abdomen: No acute abnormality. Musculoskeletal: No chest wall abnormality. No acute or significant osseous findings. Review of the MIP images confirms the above findings. IMPRESSION: 1. Negative examination for pulmonary embolism. 2. Diffuse bilateral bronchial wall thickening and scattered bronchial plugging throughout, consistent with nonspecific infectious or inflammatory bronchitis. 3. Emphysema. Emphysema (ICD10-J43.9). Electronically Signed   By: Lauralyn Primes M.D.   On: 11/14/2020 12:32

## 2020-11-15 NOTE — Progress Notes (Signed)
SATURATION QUALIFICATIONS: (This note is used to comply with regulatory documentation for home oxygen)  Patient Saturations on Room Air at Rest = 87%  Patient Saturations on Room Air while Ambulating = 84%  Patient Saturations on 5 Liters of oxygen while Ambulating = 90%  Please briefly explain why patient needs home oxygen: pt unable to maintain oxygen saturations while ambulating

## 2020-11-16 LAB — CBC WITH DIFFERENTIAL/PLATELET
Abs Immature Granulocytes: 0.1 10*3/uL — ABNORMAL HIGH (ref 0.00–0.07)
Basophils Absolute: 0 10*3/uL (ref 0.0–0.1)
Basophils Relative: 0 %
Eosinophils Absolute: 0 10*3/uL (ref 0.0–0.5)
Eosinophils Relative: 0 %
HCT: 38.1 % (ref 36.0–46.0)
Hemoglobin: 12.9 g/dL (ref 12.0–15.0)
Immature Granulocytes: 1 %
Lymphocytes Relative: 8 %
Lymphs Abs: 0.9 10*3/uL (ref 0.7–4.0)
MCH: 31.5 pg (ref 26.0–34.0)
MCHC: 33.9 g/dL (ref 30.0–36.0)
MCV: 92.9 fL (ref 80.0–100.0)
Monocytes Absolute: 0.3 10*3/uL (ref 0.1–1.0)
Monocytes Relative: 3 %
Neutro Abs: 9.6 10*3/uL — ABNORMAL HIGH (ref 1.7–7.7)
Neutrophils Relative %: 88 %
Platelets: 269 10*3/uL (ref 150–400)
RBC: 4.1 MIL/uL (ref 3.87–5.11)
RDW: 13 % (ref 11.5–15.5)
WBC: 10.8 10*3/uL — ABNORMAL HIGH (ref 4.0–10.5)
nRBC: 0 % (ref 0.0–0.2)

## 2020-11-16 LAB — COMPREHENSIVE METABOLIC PANEL
ALT: 16 U/L (ref 0–44)
AST: 14 U/L — ABNORMAL LOW (ref 15–41)
Albumin: 3.3 g/dL — ABNORMAL LOW (ref 3.5–5.0)
Alkaline Phosphatase: 32 U/L — ABNORMAL LOW (ref 38–126)
Anion gap: 11 (ref 5–15)
BUN: 7 mg/dL (ref 6–20)
CO2: 24 mmol/L (ref 22–32)
Calcium: 8.9 mg/dL (ref 8.9–10.3)
Chloride: 101 mmol/L (ref 98–111)
Creatinine, Ser: 0.57 mg/dL (ref 0.44–1.00)
GFR, Estimated: >60 mL/min (ref >60)
Glucose, Bld: 156 mg/dL — ABNORMAL HIGH (ref 70–99)
Potassium: 3.9 mmol/L (ref 3.5–5.1)
Sodium: 136 mmol/L (ref 135–145)
Total Bilirubin: <0.1 mg/dL — ABNORMAL LOW (ref 0.3–1.2)
Total Protein: 6 g/dL — ABNORMAL LOW (ref 6.5–8.1)

## 2020-11-16 LAB — MAGNESIUM: Magnesium: 1.9 mg/dL (ref 1.7–2.4)

## 2020-11-16 LAB — BRAIN NATRIURETIC PEPTIDE: B Natriuretic Peptide: 56.8 pg/mL (ref 0.0–100.0)

## 2020-11-16 MED ORDER — METHYLPREDNISOLONE SODIUM SUCC 125 MG IJ SOLR
60.0000 mg | Freq: Two times a day (BID) | INTRAMUSCULAR | Status: DC
  Administered 2020-11-16 – 2020-11-17 (×2): 60 mg via INTRAVENOUS
  Filled 2020-11-16 (×2): qty 2

## 2020-11-16 MED ORDER — GUAIFENESIN-DM 100-10 MG/5ML PO SYRP
5.0000 mL | ORAL_SOLUTION | ORAL | Status: DC | PRN
  Administered 2020-11-16 – 2020-11-17 (×2): 5 mL via ORAL
  Filled 2020-11-16 (×2): qty 5

## 2020-11-16 NOTE — Plan of Care (Signed)
  Problem: Clinical Measurements: Goal: Diagnostic test results will improve Outcome: Progressing   Problem: Activity: Goal: Risk for activity intolerance will decrease Outcome: Progressing   Problem: Nutrition: Goal: Adequate nutrition will be maintained Outcome: Progressing   

## 2020-11-16 NOTE — Progress Notes (Signed)
PROGRESS NOTE                                                                                                                                                                                                             Patient Demographics:    Stacy Rogers, is a 35 y.o. female, DOB - 1985-10-03, BWG:[id number]  Outpatient Primary MD for the patient is Benita Stabile, MD    LOS - 1  Admit date - 11/14/2020    Chief Complaint  Patient presents with   Shortness of Breath       Brief Narrative (HPI from H&P) - Stacy Rogers is a 35 y.o. female with medical history significant of asthma/COPD, anxiety, depression,  tobacco abuse, and COVID-19 infection 1 month ago presents with complaints of progressively worsening shortness of breath over the last 1 week.  He was diagnosed with COPD exacerbation and admitted to the hospital.   Subjective:   Patient in bed, appears comfortable, denies any headache, no fever, no chest pain or pressure, ++ wheezing but improved shortness of breath , no abdominal pain. No new focal weakness.    Assessment  & Plan :     Acute Hypoxic Resp. Failure due to Acute on chronic COPD exacerbation along with atypical bacterial infection induced acute bronchitis - she is currently on IV steroids, oxygen supplementation, have placed her on Levaquin for 3 days, encouraged to sit up in chair in the daytime use I-S and flutter valve for pulmonary toiletry.  Will advance activity and titrate down oxygen as possible.  She has severe exacerbation and still has significant wheezing, I think she will take another 2 to 3 days to improve and be back to her baseline, there is a small chance she might require home oxygen as well.  SpO2: 94 % O2 Flow Rate (L/min): 2 L/min   2.  Ongoing smoking.  Counseled to quit.  3.  Hypokalemia.  Replaced.  4.  Moderate protein calorie malnutrition BMI 17.  On protein shakes.       Condition - Fair  Family Communication  :  None present  Code Status :  Full  Consults  :  None  PUD Prophylaxis :    Procedures  :      CT - Bronchitis      Disposition Plan  :  Status is: Inpt    Dispo: The patient is from: Home              Anticipated d/c is to: Home              Patient currently is not medically stable to d/c.   Difficult to place patient No  DVT Prophylaxis  :    enoxaparin (LOVENOX) injection 40 mg Start: 11/14/20 1600    Lab Results  Component Value Date   PLT 269 11/16/2020    Diet :  Diet Order             Diet regular Room service appropriate? Yes; Fluid consistency: Thin  Diet effective now                    Inpatient Medications  Scheduled Meds:  arformoterol  15 mcg Nebulization BID   budesonide (PULMICORT) nebulizer solution  0.5 mg Nebulization BID   enoxaparin (LOVENOX) injection  40 mg Subcutaneous Q24H   feeding supplement  237 mL Oral BID BM   guaiFENesin  600 mg Oral BID   ipratropium-albuterol  3 mL Nebulization QID   levofloxacin  500 mg Oral Daily   methylPREDNISolone (SOLU-MEDROL) injection  60 mg Intravenous Q12H   nicotine  21 mg Transdermal Daily   sodium chloride flush  3 mL Intravenous Q12H   Continuous Infusions: PRN Meds:.acetaminophen **OR** [DISCONTINUED] acetaminophen, albuterol, ALPRAZolam, [DISCONTINUED] ondansetron **OR** ondansetron (ZOFRAN) IV  Antibiotics  :    Anti-infectives (From admission, onward)    Start     Dose/Rate Route Frequency Ordered Stop   11/15/20 1200  levofloxacin (LEVAQUIN) tablet 500 mg        500 mg Oral Daily 11/15/20 1025 11/18/20 0959   11/14/20 1545  cefTRIAXone (ROCEPHIN) 1 g in sodium chloride 0.9 % 100 mL IVPB  Status:  Discontinued        1 g 200 mL/hr over 30 Minutes Intravenous Every 24 hours 11/14/20 1530 11/15/20 1025        Time Spent in minutes  30   Susa Raring M.D on 11/16/2020 at 11:35 AM  To page go to [email or web address]   [facility] -  Office  [phone]    See all Orders from today for further details    Objective:   Vitals:   11/15/20 1748 11/15/20 2148 11/16/20 0810 11/16/20 0825  BP:  110/67  (!) 109/58  Pulse:  100 89 88  Resp:  20 20 18   Temp:  98.2 F (36.8 C)  (!) 97.5 F (36.4 C)  TempSrc:  Oral  Oral  SpO2: 91% 94%  94%  Weight:      Height:        Wt Readings from Last 3 Encounters:  11/14/20 45.4 kg  10/02/19 44.5 kg  09/04/19 44.7 kg     Intake/Output Summary (Last 24 hours) at 11/16/2020 1135 Last data filed at 11/15/2020 2148 Gross per 24 hour  Intake 200 ml  Output --  Net 200 ml     Physical Exam  Awake Alert, No new F.N deficits, Normal affect [place].AT,PERRAL Supple Neck,No JVD, No cervical lymphadenopathy appriciated.  Symmetrical Chest wall movement, Mod air movement bilaterally, ++ wheezing RRR,No Gallops, Rubs or new Murmurs, No Parasternal Heave +ve B.Sounds, Abd Soft, No tenderness, No organomegaly appriciated, No rebound - guarding or rigidity. No Cyanosis, Clubbing or edema, No new Rash or bruise     Data Review:    CBC Recent  Labs  Lab 11/14/20 0605 11/15/20 0554 11/16/20 0330  WBC 9.8 9.2 10.8*  HGB 14.1 12.9 12.9  HCT 41.6 39.4 38.1  PLT 240 255 269  MCV 94.3 95.2 92.9  MCH 32.0 31.2 31.5  MCHC 33.9 32.7 33.9  RDW 13.1 13.0 13.0  LYMPHSABS 3.0  --  0.9  MONOABS 0.7  --  0.3  EOSABS 0.0  --  0.0  BASOSABS 0.0  --  0.0    Recent Labs  Lab 11/14/20 0605 11/15/20 0554 11/15/20 0809 11/16/20 0330  NA 140 136  --  136  K 3.1* 4.2  --  3.9  CL 103 106  --  101  CO2 28 23  --  24  GLUCOSE 94 89  --  156*  BUN <5* 5*  --  7  CREATININE 0.64 0.47  --  0.57  CALCIUM 9.0 8.7*  --  8.9  AST  --  13*  --  14*  ALT  --  13  --  16  ALKPHOS  --  32*  --  32*  BILITOT  --  0.3  --  <0.1*  ALBUMIN  --  3.2*  --  3.3*  MG  --   --   --  1.9  BNP  --   --  81.3 56.8     ------------------------------------------------------------------------------------------------------------------ No results for input(s): CHOL, HDL, LDLCALC, TRIG, CHOLHDL, LDLDIRECT in the last 72 hours.  No results found for: HGBA1C ------------------------------------------------------------------------------------------------------------------ No results for input(s): TSH, T4TOTAL, T3FREE, THYROIDAB in the last 72 hours.  Invalid input(s): FREET3  Cardiac Enzymes No results for input(s): CKMB, TROPONINI, MYOGLOBIN in the last 168 hours.  Invalid input(s): CK ------------------------------------------------------------------------------------------------------------------    Component Value Date/Time   BNP 56.8 11/16/2020 0330      Radiology Reports DG Chest 2 View  Result Date: 11/14/2020 CLINICAL DATA:  35 year old female with history of cough and shortness of breath with exertion for the past few days. History of COVID infection 1 month ago. EXAM: CHEST - 2 VIEW COMPARISON:  Chest x-ray 01/07/2019. FINDINGS: Lung volumes are increased with emphysematous changes. Lung volumes are normal. No consolidative airspace disease. No pleural effusions. No pneumothorax. Density in the medial aspect of the apex of the right hemithorax likely to reflect an area of chronic post infectious or inflammatory scarring, better demonstrated on prior neck CT 07/12/2018. Additional areas of pleuroparenchymal thickening are noted in the lung apices bilaterally. Pulmonary vasculature and the cardiomediastinal silhouette are within normal limits. IMPRESSION: 1. No radiographic evidence of acute cardiopulmonary disease. 2. Emphysema. 3. 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 Electronically Signed   By: Trudie Reedaniel  Entrikin M.D.   On: 11/14/2020 06:38   CT Angio Chest PE W and/or Wo Contrast  Result Date: 11/14/2020 CLINICAL DATA:  PE rule out EXAM: CT  ANGIOGRAPHY CHEST WITH CONTRAST TECHNIQUE: Multidetector CT imaging of the chest was performed using the standard protocol during bolus administration of intravenous contrast. Multiplanar CT image reconstructions and MIPs were obtained to evaluate the vascular anatomy. CONTRAST:  100mL OMNIPAQUE IOHEXOL 350 MG/ML SOLN COMPARISON:  None. FINDINGS: Cardiovascular: Satisfactory opacification of the pulmonary arteries to the segmental level. No evidence of pulmonary embolism. Normal heart size. No pericardial effusion. Mediastinum/Nodes: No enlarged mediastinal, hilar, or axillary lymph nodes. Thyroid gland, trachea, and esophagus demonstrate no significant findings. Lungs/Pleura: Mild, predominantly paraseptal emphysema. Diffuse bilateral bronchial wall thickening and scattered bronchial plugging throughout no pleural effusion or  pneumothorax. Upper Abdomen: No acute abnormality. Musculoskeletal: No chest wall abnormality. No acute or significant osseous findings. Review of the MIP images confirms the above findings. IMPRESSION: 1. Negative examination for pulmonary embolism. 2. Diffuse bilateral bronchial wall thickening and scattered bronchial plugging throughout, consistent with nonspecific infectious or inflammatory bronchitis. 3. Emphysema. Emphysema (ICD10-J43.9). Electronically Signed   By: Lauralyn Primes M.D.   On: 11/14/2020 12:32

## 2020-11-17 LAB — CBC WITH DIFFERENTIAL/PLATELET
Abs Immature Granulocytes: 0.29 10*3/uL — ABNORMAL HIGH (ref 0.00–0.07)
Basophils Absolute: 0.1 10*3/uL (ref 0.0–0.1)
Basophils Relative: 0 %
Eosinophils Absolute: 0 10*3/uL (ref 0.0–0.5)
Eosinophils Relative: 0 %
HCT: 37.9 % (ref 36.0–46.0)
Hemoglobin: 12.7 g/dL (ref 12.0–15.0)
Immature Granulocytes: 2 %
Lymphocytes Relative: 13 %
Lymphs Abs: 1.7 10*3/uL (ref 0.7–4.0)
MCH: 31.4 pg (ref 26.0–34.0)
MCHC: 33.5 g/dL (ref 30.0–36.0)
MCV: 93.6 fL (ref 80.0–100.0)
Monocytes Absolute: 0.7 10*3/uL (ref 0.1–1.0)
Monocytes Relative: 5 %
Neutro Abs: 11 10*3/uL — ABNORMAL HIGH (ref 1.7–7.7)
Neutrophils Relative %: 80 %
Platelets: 287 10*3/uL (ref 150–400)
RBC: 4.05 MIL/uL (ref 3.87–5.11)
RDW: 13.2 % (ref 11.5–15.5)
WBC: 13.7 10*3/uL — ABNORMAL HIGH (ref 4.0–10.5)
nRBC: 0 % (ref 0.0–0.2)

## 2020-11-17 LAB — COMPREHENSIVE METABOLIC PANEL
ALT: 17 U/L (ref 0–44)
AST: 17 U/L (ref 15–41)
Albumin: 3.2 g/dL — ABNORMAL LOW (ref 3.5–5.0)
Alkaline Phosphatase: 31 U/L — ABNORMAL LOW (ref 38–126)
Anion gap: 7 (ref 5–15)
BUN: 11 mg/dL (ref 6–20)
CO2: 28 mmol/L (ref 22–32)
Calcium: 8.7 mg/dL — ABNORMAL LOW (ref 8.9–10.3)
Chloride: 102 mmol/L (ref 98–111)
Creatinine, Ser: 0.56 mg/dL (ref 0.44–1.00)
GFR, Estimated: >60 mL/min (ref >60)
Glucose, Bld: 107 mg/dL — ABNORMAL HIGH (ref 70–99)
Potassium: 4.1 mmol/L (ref 3.5–5.1)
Sodium: 137 mmol/L (ref 135–145)
Total Bilirubin: 0.2 mg/dL — ABNORMAL LOW (ref 0.3–1.2)
Total Protein: 5.9 g/dL — ABNORMAL LOW (ref 6.5–8.1)

## 2020-11-17 LAB — BRAIN NATRIURETIC PEPTIDE: B Natriuretic Peptide: 51.4 pg/mL (ref 0.0–100.0)

## 2020-11-17 LAB — MAGNESIUM: Magnesium: 2 mg/dL (ref 1.7–2.4)

## 2020-11-17 MED ORDER — SALINE SPRAY 0.65 % NA SOLN
1.0000 | NASAL | Status: DC | PRN
  Filled 2020-11-17: qty 44

## 2020-11-17 MED ORDER — METHYLPREDNISOLONE SODIUM SUCC 40 MG IJ SOLR
40.0000 mg | Freq: Two times a day (BID) | INTRAMUSCULAR | Status: DC
  Administered 2020-11-17 – 2020-11-18 (×2): 40 mg via INTRAVENOUS
  Filled 2020-11-17 (×2): qty 1

## 2020-11-17 MED ORDER — IPRATROPIUM-ALBUTEROL 0.5-2.5 (3) MG/3ML IN SOLN
3.0000 mL | Freq: Three times a day (TID) | RESPIRATORY_TRACT | Status: DC
  Administered 2020-11-17 – 2020-11-18 (×3): 3 mL via RESPIRATORY_TRACT
  Filled 2020-11-17 (×3): qty 3

## 2020-11-17 NOTE — Progress Notes (Signed)
PROGRESS NOTE  Stacy Rogers HWE:[id number] DOB: 1986-04-09 DOA: 11/14/2020 PCP: Benita Stabile, MD   LOS: 2 days   Brief narrative:  Stacy Rogers is a 35 y.o. female with medical history significant of asthma/COPD, anxiety, depression,  tobacco abuse, and COVID-19 infection 1 month ago presents with complaints of progressively worsening shortness of breath over the last 1 week.  Patient was diagnosed with COPD exacerbation and admitted to the hospital.  Assessment/Plan:  Principal Problem:   Acute respiratory failure with hypoxia (HCC) Active Problems:   Tobacco abuse   Underweight   Acute bronchitis   Hypokalemia   History of COVID-19   Acute hypoxic respiratory failure secondary to COPD exacerbation with  acute bronchitis -  Patient was started on IV steroids oxygen Levaquin and still respiratory flora.  We will continue to wean oxygen as able.  Ambulate the patient.  We will decrease IV Solu-Medrol to 40 twice daily.  We will plan for oxygen desaturation study in AM.  Ongoing smoking.  Emphasized need for quitting.  Continue nicotine patch   Hypokalemia.  Replaced.  Potassium of 4.1 today    DVT prophylaxis: enoxaparin (LOVENOX) injection 40 mg Start: 11/14/20 1600    Code Status: Full code  Family Communication: None  Status is: Inpatient  Remains inpatient appropriate because:IV treatments appropriate due to intensity of illness or inability to take PO and Inpatient level of care appropriate due to severity of illness  Dispo: The patient is from: Home              Anticipated d/c is to: Home likely tomorrow if the patient remains stable.  Will wean off oxygen              Patient currently is not medically stable to d/c.   Difficult to place patient No   Consultants: None  Procedures: None  Anti-infectives:  Levaquin  Anti-infectives (From admission, onward)    Start     Dose/Rate Route Frequency Ordered Stop   11/15/20 1200  levofloxacin  (LEVAQUIN) tablet 500 mg        500 mg Oral Daily 11/15/20 1025 11/17/20 0821   11/14/20 1545  cefTRIAXone (ROCEPHIN) 1 g in sodium chloride 0.9 % 100 mL IVPB  Status:  Discontinued        1 g 200 mL/hr over 30 Minutes Intravenous Every 24 hours 11/14/20 1530 11/15/20 1025        Subjective: Today, patient was seen and examined at bedside.  Feels little better than yesterday still has some shortness of breath cough  Objective: Vitals:   11/17/20 0732 11/17/20 1248  BP:  112/68  Pulse:  95  Resp:  18  Temp:  98.3 F (36.8 C)  SpO2: 95% 95%   No intake or output data in the 24 hours ending 11/17/20 1452 Filed Weights   11/14/20 0558  Weight: 45.4 kg   Body mass index is 17.16 kg/m.   Physical Exam:  GENERAL: Patient is alert awake and oriented. Not in obvious distress.  On 2 L of nasal oxygen, thinly built HENT: No scleral pallor or icterus. Pupils equally reactive to light. Oral mucosa is moist NECK: is supple, no gross swelling noted. CHEST: Diminished respirations bilaterally.  No obvious rhonchi. CVS: S1 and S2 heard, no murmur. Regular rate and rhythm.  ABDOMEN: Soft, non-tender, bowel sounds are present. EXTREMITIES: No edema. CNS: Cranial nerves are intact. No focal motor deficits. SKIN: warm and dry without rashes.  Data Review: I  have personally reviewed the following laboratory data and studies,  CBC: Recent Labs  Lab 11/14/20 0605 11/15/20 0554 11/16/20 0330 11/17/20 0446  WBC 9.8 9.2 10.8* 13.7*  NEUTROABS 6.0  --  9.6* 11.0*  HGB 14.1 12.9 12.9 12.7  HCT 41.6 39.4 38.1 37.9  MCV 94.3 95.2 92.9 93.6  PLT 240 255 269 287   Basic Metabolic Panel: Recent Labs  Lab 11/14/20 0605 11/15/20 0554 11/16/20 0330 11/17/20 0446  NA 140 136 136 137  K 3.1* 4.2 3.9 4.1  CL 103 106 101 102  CO2 28 23 24 28   GLUCOSE 94 89 156* 107*  BUN <5* 5* 7 11  CREATININE 0.64 0.47 0.57 0.56  CALCIUM 9.0 8.7* 8.9 8.7*  MG  --   --  1.9 2.0   Liver Function  Tests: Recent Labs  Lab 11/15/20 0554 11/16/20 0330 11/17/20 0446  AST 13* 14* 17  ALT 13 16 17   ALKPHOS 32* 32* 31*  BILITOT 0.3 <0.1* 0.2*  PROT 6.1* 6.0* 5.9*  ALBUMIN 3.2* 3.3* 3.2*   No results for input(s): LIPASE, AMYLASE in the last 168 hours. No results for input(s): AMMONIA in the last 168 hours. Cardiac Enzymes: No results for input(s): CKTOTAL, CKMB, CKMBINDEX, TROPONINI in the last 168 hours. BNP (last 3 results) Recent Labs    11/15/20 0809 11/16/20 0330 11/17/20 0446  BNP 81.3 56.8 51.4    ProBNP (last 3 results) No results for input(s): PROBNP in the last 8760 hours.  CBG: No results for input(s): GLUCAP in the last 168 hours. Recent Results (from the past 240 hour(s))  Resp Panel by RT-PCR (Flu A&B, Covid) Nasopharyngeal Swab     Status: None   Collection Time: 11/14/20  9:27 AM   Specimen: Nasopharyngeal Swab; Nasopharyngeal(NP) swabs in vial transport medium  Result Value Ref Range Status   SARS Coronavirus 2 by RT PCR NEGATIVE NEGATIVE Final    Comment: (NOTE) SARS-CoV-2 target nucleic acids are NOT DETECTED.  The SARS-CoV-2 RNA is generally detectable in upper respiratory specimens during the acute phase of infection. The lowest concentration of SARS-CoV-2 viral copies this assay can detect is 138 copies/mL. A negative result does not preclude SARS-Cov-2 infection and should not be used as the sole basis for treatment or other patient management decisions. A negative result may occur with  improper specimen collection/handling, submission of specimen other than nasopharyngeal swab, presence of viral mutation(s) within the areas targeted by this assay, and inadequate number of viral copies(<138 copies/mL). A negative result must be combined with clinical observations, patient history, and epidemiological information. The expected result is Negative.  Fact Sheet for Patients:  01/17/21  Fact Sheet for  Healthcare Providers:  01/14/21  This test is no t yet approved or cleared by the [employer] FDA and  has been authorized for detection and/or diagnosis of SARS-CoV-2 by FDA under an Emergency Use Authorization (EUA). This EUA will remain  in effect (meaning this test can be used) for the duration of the COVID-19 declaration under Section 564(b)(1) of the Act, 21 U.S.C.section 360bbb-3(b)(1), unless the authorization is terminated  or revoked sooner.       Influenza A by PCR NEGATIVE NEGATIVE Final   Influenza B by PCR NEGATIVE NEGATIVE Final    Comment: (NOTE) The Xpert Xpress SARS-CoV-2/FLU/RSV plus assay is intended as an aid in the diagnosis of influenza from Nasopharyngeal swab specimens and should not be used as a sole basis for treatment. Nasal washings and aspirates are  unacceptable for Xpert Xpress SARS-CoV-2/FLU/RSV testing.  Fact Sheet for Patients: BloggerCourse.com  Fact Sheet for Healthcare Providers: SeriousBroker.it  This test is not yet approved or cleared by the [place] FDA and has been authorized for detection and/or diagnosis of SARS-CoV-2 by FDA under an Emergency Use Authorization (EUA). This EUA will remain in effect (meaning this test can be used) for the duration of the COVID-19 declaration under Section 564(b)(1) of the Act, 21 U.S.C. section 360bbb-3(b)(1), unless the authorization is terminated or revoked.  Performed at [facility], [address] [place], [place] [address]      Studies: No results found.    Joycelyn Das, MD  [facility] 11/17/2020  If 7PM-7AM, please contact night-coverage

## 2020-11-17 NOTE — Progress Notes (Signed)
Pt has been weaned off of oxygen, at tyhis time her SPO2 = 93% room air, as of 2:00pm

## 2020-11-17 NOTE — Progress Notes (Signed)
Pt SpO2 on RA 90%, Albuterol given and SpO2 increased to 95% on RA. Pt verbalized improvement. Will continue to monitor pt and treat per MD orders.

## 2020-11-18 LAB — BASIC METABOLIC PANEL
Anion gap: 7 (ref 5–15)
BUN: 15 mg/dL (ref 6–20)
CO2: 28 mmol/L (ref 22–32)
Calcium: 9 mg/dL (ref 8.9–10.3)
Chloride: 102 mmol/L (ref 98–111)
Creatinine, Ser: 0.49 mg/dL (ref 0.44–1.00)
GFR, Estimated: >60 mL/min (ref >60)
Glucose, Bld: 91 mg/dL (ref 70–99)
Potassium: 3.7 mmol/L (ref 3.5–5.1)
Sodium: 137 mmol/L (ref 135–145)

## 2020-11-18 LAB — MAGNESIUM: Magnesium: 2.1 mg/dL (ref 1.7–2.4)

## 2020-11-18 MED ORDER — NICOTINE 21 MG/24HR TD PT24: 21.0000 mg | MEDICATED_PATCH | Freq: Every day | TRANSDERMAL | 0 refills | Status: AC

## 2020-11-18 MED ORDER — PREDNISONE 10 MG PO TABS: ORAL_TABLET | ORAL | 0 refills | Status: DC

## 2020-11-18 MED ORDER — ENSURE ENLIVE PO LIQD: 237.0000 mL | Freq: Two times a day (BID) | ORAL | 12 refills | Status: AC

## 2020-11-18 MED ORDER — ALBUTEROL SULFATE (2.5 MG/3ML) 0.083% IN NEBU: 2.5000 mg | INHALATION_SOLUTION | Freq: Four times a day (QID) | RESPIRATORY_TRACT | 12 refills | Status: AC | PRN

## 2020-11-18 MED ORDER — GUAIFENESIN-DM 100-10 MG/5ML PO SYRP: 5.0000 mL | ORAL_SOLUTION | ORAL | 0 refills | Status: DC | PRN

## 2020-11-18 NOTE — Plan of Care (Signed)
  Problem: Education: Goal: Knowledge of General Education information will improve Description: Including pain rating scale, medication(s)/side effects and non-pharmacologic comfort measures Outcome: Adequate for Discharge   

## 2020-11-18 NOTE — Discharge Summary (Signed)
Physician Discharge Summary  Stacy Rogers MWN:[id number] DOB: 1985-08-03 DOA: 11/14/2020  PCP: Benita Stabile, MD  Admit date: 11/14/2020 Discharge date: 11/18/2020  Admitted From: Home  Discharge disposition: Home  Recommendations for Outpatient Follow-Up:   Follow up with your primary care provider in one week.  Check CBC, BMP, magnesium in the next visit  Discharge Diagnosis:   Principal Problem:   Acute respiratory failure with hypoxia (HCC) Active Problems:   Tobacco abuse   Underweight   Acute bronchitis   Hypokalemia   History of COVID-19   Discharge Condition: Improved.  Diet recommendation: Regular  Wound care: None.  Code status: Full.   History of Present Illness:   Stacy Rogers is a 35 y.o. female with medical history significant of asthma/COPD, anxiety, depression,  tobacco abuse, and COVID-19 infection 1 month ago presents with complaints of progressively worsening shortness of breath over the last 1 week.  Patient was diagnosed with COPD exacerbation and was admitted to the hospital. Hospital Course:   Following conditions were addressed during hospitalization as listed below,  Acute hypoxic respiratory failure secondary to COPD exacerbation with  acute bronchitis -  Patient was started on IV steroids, oxygen during hospitalization.  Patient was initially on supplemental oxygen which was weaned off..  Patient clinically improved and was prescribed with prednisone taper on discharge.   Ongoing smoking.  Emphasized need for quitting.  We will continue nicotine patch on discharge.   Hypokalemia.  Replaced.  Potassium of 4.1 today   Disposition.  At this time, patient is stable for disposition home with outpatient PCP follow-up.  Medical Consultants:   None.  Procedures:    None Subjective:   Today, patient was seen and examined at bedside.  Patient feels much better with breathing today.  Wishes to go home.  Discharge Exam:   Vitals:    11/17/20 2240 11/18/20 0548  BP:  109/76  Pulse:    Resp:  18  Temp:  97.9 F (36.6 C)  SpO2: 95% 94%   Vitals:   11/17/20 1939 11/17/20 2222 11/17/20 2240 11/18/20 0548  BP:  105/60  109/76  Pulse:  91    Resp:  18  18  Temp:  98.9 F (37.2 C)  97.9 F (36.6 C)  TempSrc:  Oral  Oral  SpO2: 92% 90% 95% 94%  Weight:      Height:        General: Alert awake, not in obvious distress, thinly built, HENT: pupils equally reacting to light,  No scleral pallor or icterus noted. Oral mucosa is moist.  Chest:  .  Diminished breath sounds bilaterally. CVS: S1 &S2 heard. No murmur.  Regular rate and rhythm. Abdomen: Soft, nontender, nondistended.  Bowel sounds are heard.   Extremities: No cyanosis, clubbing or edema.  Peripheral pulses are palpable. Psych: Alert, awake and oriented, normal mood CNS:  No cranial nerve deficits.  Power equal in all extremities.   Skin: Warm and dry.  No rashes noted.  The results of significant diagnostics from this hospitalization (including imaging, microbiology, ancillary and laboratory) are listed below for reference.     Diagnostic Studies:   DG Chest 2 View  Result Date: 11/14/2020 CLINICAL DATA:  35 year old female with history of cough and shortness of breath with exertion for the past few days. History of COVID infection 1 month ago. EXAM: CHEST - 2 VIEW COMPARISON:  Chest x-ray 01/07/2019. FINDINGS: Lung volumes are increased with emphysematous changes. Lung volumes are normal. No  consolidative airspace disease. No pleural effusions. No pneumothorax. Density in the medial aspect of the apex of the right hemithorax likely to reflect an area of chronic post infectious or inflammatory scarring, better demonstrated on prior neck CT 07/12/2018. Additional areas of pleuroparenchymal thickening are noted in the lung apices bilaterally. Pulmonary vasculature and the cardiomediastinal silhouette are within normal limits. IMPRESSION: 1. No radiographic  evidence of acute cardiopulmonary disease. 2. Emphysema. 3. 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 Electronically Signed   By: Trudie Reed M.D.   On: 11/14/2020 06:38   CT Angio Chest PE W and/or Wo Contrast  Result Date: 11/14/2020 CLINICAL DATA:  PE rule out EXAM: CT ANGIOGRAPHY CHEST WITH CONTRAST TECHNIQUE: Multidetector CT imaging of the chest was performed using the standard protocol during bolus administration of intravenous contrast. Multiplanar CT image reconstructions and MIPs were obtained to evaluate the vascular anatomy. CONTRAST:  OMNIPAQUE IOHEXOL 350 MG/ML SOLN COMPARISON:  None. FINDINGS: Cardiovascular: Satisfactory opacification of the pulmonary arteries to the segmental level. No evidence of pulmonary embolism. Normal heart size. No pericardial effusion. Mediastinum/Nodes: No enlarged mediastinal, hilar, or axillary lymph nodes. Thyroid gland, trachea, and esophagus demonstrate no significant findings. Lungs/Pleura: Mild, predominantly paraseptal emphysema. Diffuse bilateral bronchial wall thickening and scattered bronchial plugging throughout no pleural effusion or pneumothorax. Upper Abdomen: No acute abnormality. Musculoskeletal: No chest wall abnormality. No acute or significant osseous findings. Review of the MIP images confirms the above findings. IMPRESSION: 1. Negative examination for pulmonary embolism. 2. Diffuse bilateral bronchial wall thickening and scattered bronchial plugging throughout, consistent with nonspecific infectious or inflammatory bronchitis. 3. Emphysema. Emphysema (ICD10-J43.9). Electronically Signed   By: Lauralyn Primes M.D.   On: 11/14/2020 12:32     Labs:   Basic Metabolic Panel: Recent Labs  Lab 11/14/20 0605 11/15/20 0554 11/16/20 0330 11/17/20 0446 11/18/20 0456  NA 140 136 136 137 137  K 3.1* 4.2 3.9 4.1 3.7  CL 103 106 101 102 102  CO2 28 23 24 28 28   GLUCOSE 94 89 156* 107*  91  BUN <5* 5* 7 11 15   CREATININE 0.64 0.47 0.57 0.56 0.49  CALCIUM 9.0 8.7* 8.9 8.7* 9.0  MG  --   --  1.9 2.0 2.1   GFR Estimated Creatinine Clearance: 69 mL/min (by C-G formula based on SCr of 0.49 mg/dL). Liver Function Tests: Recent Labs  Lab 11/15/20 0554 11/16/20 0330 11/17/20 0446  AST 13* 14* 17  ALT 13 16 17   ALKPHOS 32* 32* 31*  BILITOT 0.3 <0.1* 0.2*  PROT 6.1* 6.0* 5.9*  ALBUMIN 3.2* 3.3* 3.2*   No results for input(s): LIPASE, AMYLASE in the last 168 hours. No results for input(s): AMMONIA in the last 168 hours. Coagulation profile No results for input(s): INR, PROTIME in the last 168 hours.  CBC: Recent Labs  Lab 11/14/20 0605 11/15/20 0554 11/16/20 0330 11/17/20 0446  WBC 9.8 9.2 10.8* 13.7*  NEUTROABS 6.0  --  9.6* 11.0*  HGB 14.1 12.9 12.9 12.7  HCT 41.6 39.4 38.1 37.9  MCV 94.3 95.2 92.9 93.6  PLT 240 255 269 287   Cardiac Enzymes: No results for input(s): CKTOTAL, CKMB, CKMBINDEX, TROPONINI in the last 168 hours. BNP: Invalid input(s): POCBNP CBG: No results for input(s): GLUCAP in the last 168 hours. D-Dimer No results for input(s): DDIMER in the last 72 hours. Hgb A1c No results for input(s): HGBA1C in the last 72 hours. Lipid Profile No results for  input(s): CHOL, HDL, LDLCALC, TRIG, CHOLHDL, LDLDIRECT in the last 72 hours. Thyroid function studies No results for input(s): TSH, T4TOTAL, T3FREE, THYROIDAB in the last 72 hours.  Invalid input(s): FREET3 Anemia work up No results for input(s): VITAMINB12, FOLATE, FERRITIN, TIBC, IRON, RETICCTPCT in the last 72 hours. Microbiology Recent Results (from the past 240 hour(s))  Resp Panel by RT-PCR (Flu A&B, Covid) Nasopharyngeal Swab     Status: None   Collection Time: 11/14/20  9:27 AM   Specimen: Nasopharyngeal Swab; Nasopharyngeal(NP) swabs in vial transport medium  Result Value Ref Range Status   SARS Coronavirus 2 by RT PCR NEGATIVE NEGATIVE Final    Comment: (NOTE) SARS-CoV-2  target nucleic acids are NOT DETECTED.  The SARS-CoV-2 RNA is generally detectable in upper respiratory specimens during the acute phase of infection. The lowest concentration of SARS-CoV-2 viral copies this assay can detect is 138 copies/mL. A negative result does not preclude SARS-Cov-2 infection and should not be used as the sole basis for treatment or other patient management decisions. A negative result may occur with  improper specimen collection/handling, submission of specimen other than nasopharyngeal swab, presence of viral mutation(s) within the areas targeted by this assay, and inadequate number of viral copies(<138 copies/mL). A negative result must be combined with clinical observations, patient history, and epidemiological information. The expected result is Negative.  Fact Sheet for Patients:  [employer]Course.com  Fact Sheet for Healthcare Providers:  SeriousBroker.it  This test is no t yet approved or cleared by the [place] FDA and  has been authorized for detection and/or diagnosis of SARS-CoV-2 by FDA under an Emergency Use Authorization (EUA). This EUA will remain  in effect (meaning this test can be used) for the duration of the COVID-19 declaration under Section 564(b)(1) of the Act, 21 U.S.C.section 360bbb-3(b)(1), unless the authorization is terminated  or revoked sooner.       Influenza A by PCR NEGATIVE NEGATIVE Final   Influenza B by PCR NEGATIVE NEGATIVE Final    Comment: (NOTE) The Xpert Xpress SARS-CoV-2/FLU/RSV plus assay is intended as an aid in the diagnosis of influenza from Nasopharyngeal swab specimens and should not be used as a sole basis for treatment. Nasal washings and aspirates are unacceptable for Xpert Xpress SARS-CoV-2/FLU/RSV testing.  Fact Sheet for Patients: BloggerCourse.com  Fact Sheet for Healthcare  Providers: SeriousBroker.it  This test is not yet approved or cleared by the [place] FDA and has been authorized for detection and/or diagnosis of SARS-CoV-2 by FDA under an Emergency Use Authorization (EUA). This EUA will remain in effect (meaning this test can be used) for the duration of the COVID-19 declaration under Section 564(b)(1) of the Act, 21 U.S.C. section 360bbb-3(b)(1), unless the authorization is terminated or revoked.  Performed at [facility], [address], [place], [place] [address]      Discharge Instructions:   Discharge Instructions     Diet general   Complete by: As directed    Discharge instructions   Complete by: As directed    Follow up with your primary care physician in one week.  Continue inhalers at home.  No smoking.   Increase activity slowly   Complete by: As directed       Allergies as of 11/18/2020       Reactions   Aspirin Swelling   Only to the lips   Aspirin Swelling   Blisters   Tramadol Nausea And Vomiting   headache  Medication List     TAKE these medications    albuterol 108 (90 Base) MCG/ACT inhaler Commonly known as: VENTOLIN HFA Inhale 2 puffs into the lungs 2 (two) times daily as needed for wheezing or shortness of breath.   albuterol (2.5 MG/3ML) 0.083% nebulizer solution Commonly known as: PROVENTIL Take 3 mLs (2.5 mg total) by nebulization every 6 (six) hours as needed for wheezing or shortness of breath.   diazepam 5 MG tablet Commonly known as: VALIUM Take 5 mg by mouth 2 (two) times daily as needed for anxiety or sleep.   feeding supplement Liqd Take 237 mLs by mouth 2 (two) times daily between meals.   guaiFENesin-dextromethorphan 100-10 MG/5ML syrup Commonly known as: ROBITUSSIN DM Take 5 mLs by mouth every 4 (four) hours as needed for cough.   methocarbamol 500 MG tablet Commonly known as: ROBAXIN Take 500 mg by mouth every 8 (eight) hours as needed  for spasms.   montelukast 10 MG tablet Commonly known as: SINGULAIR Take 1 tablet (10 mg total) by mouth at bedtime. What changed:  when to take this reasons to take this   nicotine 21 mg/24hr patch Commonly known as: NICODERM CQ - dosed in mg/24 hours Place 1 patch (21 mg total) onto the skin daily.   predniSONE 10 MG tablet Commonly known as: DELTASONE Take 4 tablets (40 mg) daily for 2 days, then, Take 3 tablets (30 mg) daily for 2 days, then, Take 2 tablets (20 mg) daily for 2 days, then, Take 1 tablets (10 mg) daily for 1 days, then stop   rizatriptan 10 MG tablet Commonly known as: MAXALT Take 10 mg by mouth as needed for migraine. May repeat in 2 hours if needed   Symbicort 160-4.5 MCG/ACT inhaler Generic drug: budesonide-formoterol Inhale 2 puffs into the lungs 2 (two) times daily.   traZODone 50 MG tablet Commonly known as: DESYREL Take 50 mg by mouth at bedtime.   valACYclovir 500 MG tablet Commonly known as: VALTREX Take 500 mg by mouth 2 (two) times daily as needed (break outs).   venlafaxine XR 75 MG 24 hr capsule Commonly known as: EFFEXOR-XR Take 75 mg by mouth daily.        Follow-up Information     Benita StabileHall, John Z, MD. Schedule an appointment as soon as possible for a visit in 1 week(s).   Specialty: Internal Medicine Contact information: [address] [facility] [id number] [phone]                  Time coordinating discharge: 39 minutes  Signed:  Khai Arrona  [facility] 11/18/2020, 8:20 AM

## 2020-11-18 NOTE — Progress Notes (Signed)
Discharge instructions (including medications) discussed with and copy provided to patient/caregiver 

## 2021-08-31 ENCOUNTER — Ambulatory Visit (HOSPITAL_COMMUNITY)
Admission: RE | Admit: 2021-08-31 | Discharge: 2021-08-31 | Disposition: A | Discharge status: Home / Self Care | Payer: BC Managed Care – PPO | Source: Ambulatory Visit | Attending: Adult Health Nurse Practitioner | Admitting: Adult Health Nurse Practitioner

## 2021-08-31 ENCOUNTER — Other Ambulatory Visit (HOSPITAL_COMMUNITY): Payer: Self-pay | Admitting: Adult Health Nurse Practitioner

## 2021-08-31 DIAGNOSIS — J430 Unilateral pulmonary emphysema [MacLeod's syndrome]: Secondary | ICD-10-CM | POA: Insufficient documentation

## 2021-11-09 ENCOUNTER — Emergency Department (HOSPITAL_COMMUNITY): Payer: BC Managed Care – PPO

## 2021-11-09 ENCOUNTER — Other Ambulatory Visit: Payer: Self-pay

## 2021-11-09 ENCOUNTER — Emergency Department (HOSPITAL_COMMUNITY)
Admission: EM | Admit: 2021-11-09 | Discharge: 2021-11-09 | Disposition: A | Discharge status: Home / Self Care | Payer: BC Managed Care – PPO | Attending: Emergency Medicine | Admitting: Emergency Medicine

## 2021-11-09 ENCOUNTER — Emergency Department (HOSPITAL_COMMUNITY)
Admission: EM | Admit: 2021-11-09 | Discharge: 2021-11-09 | Discharge status: Against Medical Advice | Payer: BC Managed Care – PPO

## 2021-11-09 ENCOUNTER — Encounter (HOSPITAL_COMMUNITY): Payer: Self-pay | Admitting: *Deleted

## 2021-11-09 DIAGNOSIS — S6991XA Unspecified injury of right wrist, hand and finger(s), initial encounter: Secondary | ICD-10-CM | POA: Diagnosis present

## 2021-11-09 DIAGNOSIS — M542 Cervicalgia: Secondary | ICD-10-CM | POA: Diagnosis not present

## 2021-11-09 DIAGNOSIS — W108XXA Fall (on) (from) other stairs and steps, initial encounter: Secondary | ICD-10-CM | POA: Diagnosis not present

## 2021-11-09 DIAGNOSIS — S62356A Nondisplaced fracture of shaft of fifth metacarpal bone, right hand, initial encounter for closed fracture: Secondary | ICD-10-CM | POA: Insufficient documentation

## 2021-11-09 DIAGNOSIS — S22028A Other fracture of second thoracic vertebra, initial encounter for closed fracture: Secondary | ICD-10-CM | POA: Diagnosis not present

## 2021-11-09 DIAGNOSIS — R519 Headache, unspecified: Secondary | ICD-10-CM | POA: Diagnosis not present

## 2021-11-09 LAB — COMPREHENSIVE METABOLIC PANEL
ALT: 14 U/L (ref 0–44)
AST: 18 U/L (ref 15–41)
Albumin: 4.3 g/dL (ref 3.5–5.0)
Alkaline Phosphatase: 43 U/L (ref 38–126)
Anion gap: 9 (ref 5–15)
BUN: 5 mg/dL — ABNORMAL LOW (ref 6–20)
CO2: 25 mmol/L (ref 22–32)
Calcium: 8.8 mg/dL — ABNORMAL LOW (ref 8.9–10.3)
Chloride: 102 mmol/L (ref 98–111)
Creatinine, Ser: 0.61 mg/dL (ref 0.44–1.00)
GFR, Estimated: >60 mL/min (ref >60)
Glucose, Bld: 85 mg/dL (ref 70–99)
Potassium: 3.2 mmol/L — ABNORMAL LOW (ref 3.5–5.1)
Sodium: 136 mmol/L (ref 135–145)
Total Bilirubin: 0.5 mg/dL (ref 0.3–1.2)
Total Protein: 7.3 g/dL (ref 6.5–8.1)

## 2021-11-09 LAB — CBC WITH DIFFERENTIAL/PLATELET
Abs Immature Granulocytes: 0.05 10*3/uL (ref 0.00–0.07)
Basophils Absolute: 0 10*3/uL (ref 0.0–0.1)
Basophils Relative: 0 %
Eosinophils Absolute: 0 10*3/uL (ref 0.0–0.5)
Eosinophils Relative: 0 %
HCT: 43.8 % (ref 36.0–46.0)
Hemoglobin: 15.1 g/dL — ABNORMAL HIGH (ref 12.0–15.0)
Immature Granulocytes: 0 %
Lymphocytes Relative: 23 %
Lymphs Abs: 2.9 10*3/uL (ref 0.7–4.0)
MCH: 32.8 pg (ref 26.0–34.0)
MCHC: 34.5 g/dL (ref 30.0–36.0)
MCV: 95.2 fL (ref 80.0–100.0)
Monocytes Absolute: 0.6 10*3/uL (ref 0.1–1.0)
Monocytes Relative: 5 %
Neutro Abs: 9 10*3/uL — ABNORMAL HIGH (ref 1.7–7.7)
Neutrophils Relative %: 72 %
Platelets: 229 10*3/uL (ref 150–400)
RBC: 4.6 MIL/uL (ref 3.87–5.11)
RDW: 12.8 % (ref 11.5–15.5)
WBC: 12.6 10*3/uL — ABNORMAL HIGH (ref 4.0–10.5)
nRBC: 0 % (ref 0.0–0.2)

## 2021-11-09 LAB — TROPONIN I (HIGH SENSITIVITY)
Troponin I (High Sensitivity): <2 ng/L (ref <18)
Troponin I (High Sensitivity): <2 ng/L (ref <18)

## 2021-11-09 LAB — LIPASE, BLOOD: Lipase: 25 U/L (ref 11–51)

## 2021-11-09 LAB — PREGNANCY, URINE: Preg Test, Ur: NEGATIVE

## 2021-11-09 MED ORDER — LIDOCAINE HCL (PF) 1 % IJ SOLN
30.0000 mL | Freq: Once | INTRAMUSCULAR | Status: AC
  Administered 2021-11-09: 30 mL
  Filled 2021-11-09: qty 30

## 2021-11-09 MED ORDER — LACTATED RINGERS IV BOLUS
1000.0000 mL | Freq: Once | INTRAVENOUS | Status: AC
  Administered 2021-11-09: 1000 mL via INTRAVENOUS

## 2021-11-09 MED ORDER — OXYCODONE-ACETAMINOPHEN 5-325 MG PO TABS: 1.0000 | ORAL_TABLET | Freq: Four times a day (QID) | ORAL | 0 refills | Status: AC | PRN

## 2021-11-09 MED ORDER — POTASSIUM CHLORIDE CRYS ER 20 MEQ PO TBCR
20.0000 meq | EXTENDED_RELEASE_TABLET | Freq: Once | ORAL | Status: AC
  Administered 2021-11-09: 20 meq via ORAL
  Filled 2021-11-09: qty 1

## 2021-11-09 MED ORDER — ACETAMINOPHEN 500 MG PO TABS
1000.0000 mg | ORAL_TABLET | Freq: Once | ORAL | Status: AC
  Administered 2021-11-09: 1000 mg via ORAL
  Filled 2021-11-09: qty 2

## 2021-11-09 MED ORDER — FENTANYL CITRATE PF 50 MCG/ML IJ SOSY
50.0000 ug | PREFILLED_SYRINGE | Freq: Once | INTRAMUSCULAR | Status: AC
  Administered 2021-11-09: 50 ug via INTRAVENOUS
  Filled 2021-11-09: qty 1

## 2021-11-09 NOTE — ED Provider Notes (Addendum)
[facility] EMERGENCY DEPARTMENT Provider Note   CSN: [id number] Arrival date & time: 11/09/21  1836     History Chief Complaint  Patient presents with   Fall    HPI Stacy Rogers is a 36 y.o. female presenting for fall.  Patient states that she was going down some steps when she had an episode of vertigo.  She endorses a history of vertigo secondary to a ear infection.  She says that she has not had an episode in a while.  She denies fevers chills nausea vomiting, syncope shortness of breath prior to this.  Patient does have neck pain and midline thoracic back pain.  She has been ambulatory since the fall.  She also has obvious injury to her right hand.  Patient denies any other injuries at this time..   Patient's recorded medical, surgical, social, medication list and allergies were reviewed in the Snapshot window as part of the initial history.   Review of Systems   Review of Systems  Constitutional:  Negative for chills and fever.  HENT:  Negative for ear pain and sore throat.   Eyes:  Negative for pain and visual disturbance.  Respiratory:  Negative for cough and shortness of breath.   Cardiovascular:  Negative for chest pain and palpitations.  Gastrointestinal:  Negative for abdominal pain and vomiting.  Genitourinary:  Negative for dysuria and hematuria.  Musculoskeletal:  Negative for arthralgias and back pain.  Skin:  Negative for color change and rash.  Neurological:  Negative for seizures and syncope.  All other systems reviewed and are negative.   Physical Exam Updated Vital Signs BP 103/78   Pulse 68   Temp 98.4 F (36.9 C)   Resp 18   Ht 5\' 4"  (1.626 m)   Wt 53.5 kg   SpO2 96%   BMI 20.25 kg/m  Physical Exam Vitals and nursing note reviewed.  Constitutional:      General: She is not in acute distress.    Appearance: She is well-developed.  HENT:     Head: Normocephalic and atraumatic.  Eyes:     Conjunctiva/sclera: Conjunctivae normal.   Cardiovascular:     Rate and Rhythm: Normal rate and regular rhythm.     Heart sounds: No murmur heard. Pulmonary:     Effort: Pulmonary effort is normal. No respiratory distress.     Breath sounds: Normal breath sounds.  Abdominal:     General: There is no distension.     Palpations: Abdomen is soft.     Tenderness: There is no abdominal tenderness. There is no right CVA tenderness or left CVA tenderness.  Musculoskeletal:        General: Deformity (Obvious swelling at the right fifth metacarpal.  Small abrasion without laceration.  Abrasion probed to depth without evidence of open fracture.) present. No swelling. Normal range of motion.     Cervical back: Neck supple. Tenderness (Tenderness palpation to T2) present.  Skin:    General: Skin is warm and dry.  Neurological:     General: No focal deficit present.     Mental Status: She is alert and oriented to person, place, and time. Mental status is at baseline.     Cranial Nerves: No cranial nerve deficit.      ED Course/ Medical Decision Making/ A&P    Procedures Reduction of fracture  Date/Time: 11/09/2021 11:38 PM  Performed by: 01/09/2022, MD Authorized by: Glyn Ade, MD  Consent: Verbal consent obtained. Risks and benefits: risks, benefits  and alternatives were discussed Consent given by: patient Imaging studies: imaging studies available Patient identity confirmed: verbally with patient Time out: Immediately prior to procedure a "time out" was called to verify the correct patient, procedure, equipment, support staff and site/side marked as required. Preparation: Patient was prepped and draped in the usual sterile fashion. Local anesthesia used: yes Anesthesia: local infiltration  Anesthesia: Local anesthesia used: yes Local Anesthetic: lidocaine 1% without epinephrine Anesthetic total: 10 mL Patient tolerance: patient tolerated the procedure well with no immediate complications      Medications  Ordered in ED Medications  lactated ringers bolus 1,000 mL (0 mLs Intravenous Stopped 11/09/21 2116)  acetaminophen (TYLENOL) tablet 1,000 mg (1,000 mg Oral Given 11/09/21 2003)  potassium chloride SA (KLOR-CON M) CR tablet 20 mEq (20 mEq Oral Given 11/09/21 2116)  lidocaine (PF) (XYLOCAINE) 1 % injection 30 mL (30 mLs Other Given 11/09/21 2148)  fentaNYL (SUBLIMAZE) injection 50 mcg (50 mcg Intravenous Given 11/09/21 2147)    Medical Decision Making:    Stacy Rogers is a 36 y.o. female who presented to the ED today with a moderate mechanisma trauma, detailed above.    Additional history discussed with patient's family/caregivers.  Patient's presentation is complicated by their history of multiple medical problems and ongoing multiple medication uses.  Patient placed on continuous vitals and telemetry monitoring while in ED which was reviewed periodically.   Given this mechanism of trauma, a full physical exam was performed. Notably, patient was HDS in NAD.  Obvious deformity of the right fifth metacarpal.  Reviewed and confirmed nursing documentation for past medical history, family history, social history.    Initial Assessment/Plan:   With the patient's presentation of moderate mechanism trauma but an otherwise reassuring exam, patient warrants targeted evaluation for potential traumatic injuries. Will proceed with targeted evaluation for potential injuries. Will proceed with CT head, CT C-spine, CT thoracic spine.  No lumbar spinal tenderness on deep palpation.  Proceed with x-rays of right hand.  All other joints ranged with no other focal injuries or difficulties appreciated.. Objective evaluation resulted with right fifth metacarpal fracture reduced as above with interval improvement and T2 fracture discussed with neurosurgical spine team who recommended outpatient care and management.  Patient is neurologically intact with no focal neurologic abnormalities.   We will prescribe patient course  of pain medication and recommended anti-inflammatory utilization in the outpatient setting.  Strict importance of close follow-up with hand surgery and spine surgery reinforced to the patient multiple times and further reinforced by nursing.  Patient expressed understanding.  Pain under control, patient ambulatory tolerating p.o. intake at time of discharge stable for continued outpatient care management.  Further reinforced that the loss of hand function is a possible outcome of not achieving close outpatient hand follow-up and returning to the emergency department was encouraged to patient is unable to get her follow-up within 48 hours. Disposition:  Based on the above findings, I believe patient is stable for discharge.    Patient/family educated about specific return precautions for given chief complaint and symptoms.  Patient/family educated about follow-up with PCP and Neurosurg and Hand Surg.     Patient/family expressed understanding of return precautions and need for follow-up. Patient spoken to regarding all imaging and laboratory results and appropriate follow up for these results. All education provided in verbal form with additional information in written form. Time was allowed for answering of patient questions. Patient discharged.    Emergency Department Medication Summary:   Medications  lactated  ringers bolus 1,000 mL (0 mLs Intravenous Stopped 11/09/21 2116)  acetaminophen (TYLENOL) tablet 1,000 mg (1,000 mg Oral Given 11/09/21 2003)  potassium chloride SA (KLOR-CON M) CR tablet 20 mEq (20 mEq Oral Given 11/09/21 2116)  lidocaine (PF) (XYLOCAINE) 1 % injection 30 mL (30 mLs Other Given 11/09/21 2148)  fentaNYL (SUBLIMAZE) injection 50 mcg (50 mcg Intravenous Given 11/09/21 2147)          Discharge   Final Clinical Impression(s) / ED Diagnoses Final diagnoses:  Other closed fracture of second thoracic vertebra, initial encounter (HCC)  Closed nondisplaced fracture of shaft of fifth  metacarpal bone of right hand, initial encounter    Rx / DC Orders ED Discharge Orders          Ordered    oxyCODONE-acetaminophen (PERCOCET/ROXICET) 5-325 MG tablet  Every 6 hours PRN        11/09/21 2331              Glyn Ade, MD 11/09/21 2633    Glyn Ade, MD 11/09/21 2338

## 2021-11-09 NOTE — Discharge Instructions (Addendum)
You were seen today for ground-level fall and had multiple injuries.  We appreciated a T2 thoracic spine fracture and a right hand fifth metacarpal fracture.  It is critical that you follow-up with a hand surgeon and a spine surgeon within the next 48 hours.  Contact information has been placed in your chart to arrange this follow-up.  Additionally, please follow-up closely with your primary care provider for ongoing care and management.  Plan and next steps:   The following may be helpful in managing your symptoms:   Lidocaine Patches  Apply to affected area for up to 12 hours at a time.   Adult Tylenol dosing:  650 mg orally every 4 to 6 hours as needed, MAX: 3250 mg/24 hours   (Extra-strength) 1000 mg orally every 6 hours as needed; MAX: 3000 mg/24 hours   Do not use if you have liver disease. Read the label on the bottle.   Adult Ibuprofen Dosing  200 to 400 mg orally every 4 to 6 hours as needed; MAX 1200 mg/day; do not take longer than 10 days   Do not use if you have kidney disease. Read the label on the bottle   Findings:  You may see all of your lab and imaging results utilizing our online portal! Look in this document or ask a team member for your mychart* access information. The most notable results have additionally been verbally communicated with you and your bedside family.    Follow-up Plan:   Follow up with the patient's normal primary care provider for monitoring of this condition within 48 hours.   Signs/Symptoms that would warrant return to the ED:  Please return to the ED if you experience worsening of symptoms or any abrupt changes in your health. Standard of care precautions for your chief complaint have already been verbally communicated with you. Always be on alert for fevers, chills, shortness of breath, chest pains, or sudden changes that warrant immediate evaluation.    Thank you for allowing [place] to be a part of you and your families' care.   Glyn Ade MD

## 2021-11-09 NOTE — ED Notes (Signed)
Pt called 3x for triage, no answer ?

## 2021-11-09 NOTE — Progress Notes (Signed)
36 year old female fell today and complains of neck pain/upper thoracic pain. CT thoracic shows a mild T2 compression fracture with about 25% vertebral height loss, no retropulsion into the canal. T2 is a difficult place to brace, would recommend aspen collar for comfort and follow up with [place] in the office next week.

## 2021-11-09 NOTE — ED Triage Notes (Signed)
Pt states she got dizzy and lost consciousness and fell hitting her head on the ground and cutting her right hand.  Pt c/o neck pain  C collar placed during triage

## 2021-11-14 ENCOUNTER — Other Ambulatory Visit: Payer: Self-pay | Admitting: Orthopedic Surgery

## 2021-11-14 NOTE — Progress Notes (Signed)
Attempted to call patient 3x to complete pre op call for surgery added tomorrow and leave message but unable because VMB is full. Text sent as well.

## 2021-11-15 ENCOUNTER — Ambulatory Visit (HOSPITAL_BASED_OUTPATIENT_CLINIC_OR_DEPARTMENT_OTHER): Payer: BC Managed Care – PPO | Admitting: Anesthesiology

## 2021-11-15 ENCOUNTER — Ambulatory Visit (HOSPITAL_BASED_OUTPATIENT_CLINIC_OR_DEPARTMENT_OTHER): Payer: BC Managed Care – PPO

## 2021-11-15 ENCOUNTER — Ambulatory Visit (HOSPITAL_BASED_OUTPATIENT_CLINIC_OR_DEPARTMENT_OTHER)
Admission: RE | Admit: 2021-11-15 | Discharge: 2021-11-15 | Disposition: A | Discharge status: Home / Self Care | Payer: BC Managed Care – PPO | Source: Ambulatory Visit | Attending: Orthopedic Surgery | Admitting: Orthopedic Surgery

## 2021-11-15 ENCOUNTER — Encounter (HOSPITAL_BASED_OUTPATIENT_CLINIC_OR_DEPARTMENT_OTHER): Payer: Self-pay | Admitting: Orthopedic Surgery

## 2021-11-15 ENCOUNTER — Other Ambulatory Visit: Payer: Self-pay

## 2021-11-15 ENCOUNTER — Encounter (HOSPITAL_BASED_OUTPATIENT_CLINIC_OR_DEPARTMENT_OTHER)
Admission: RE | Disposition: A | Discharge status: Home / Self Care | Payer: Self-pay | Source: Ambulatory Visit | Attending: Orthopedic Surgery

## 2021-11-15 DIAGNOSIS — J45909 Unspecified asthma, uncomplicated: Secondary | ICD-10-CM | POA: Diagnosis not present

## 2021-11-15 DIAGNOSIS — W19XXXA Unspecified fall, initial encounter: Secondary | ICD-10-CM | POA: Insufficient documentation

## 2021-11-15 DIAGNOSIS — F419 Anxiety disorder, unspecified: Secondary | ICD-10-CM | POA: Insufficient documentation

## 2021-11-15 DIAGNOSIS — S62306A Unspecified fracture of fifth metacarpal bone, right hand, initial encounter for closed fracture: Secondary | ICD-10-CM | POA: Diagnosis present

## 2021-11-15 DIAGNOSIS — F17210 Nicotine dependence, cigarettes, uncomplicated: Secondary | ICD-10-CM | POA: Diagnosis not present

## 2021-11-15 DIAGNOSIS — F32A Depression, unspecified: Secondary | ICD-10-CM | POA: Diagnosis not present

## 2021-11-15 HISTORY — PX: CLOSED REDUCTION FINGER WITH PERCUTANEOUS PINNING: SHX5612

## 2021-11-15 LAB — POCT PREGNANCY, URINE: Preg Test, Ur: NEGATIVE

## 2021-11-15 SURGERY — CLOSED REDUCTION, FINGER, WITH PERCUTANEOUS PINNING
Anesthesia: Monitor Anesthesia Care | Site: Finger | Laterality: Right

## 2021-11-15 MED ORDER — MIDAZOLAM HCL 2 MG/2ML IJ SOLN
INTRAMUSCULAR | Status: AC
  Filled 2021-11-15: qty 2

## 2021-11-15 MED ORDER — PROPOFOL 500 MG/50ML IV EMUL
INTRAVENOUS | Status: DC | PRN
  Administered 2021-11-15: 75 ug/kg/min via INTRAVENOUS

## 2021-11-15 MED ORDER — CEFAZOLIN SODIUM-DEXTROSE 2-3 GM-%(50ML) IV SOLR
INTRAVENOUS | Status: DC | PRN
  Administered 2021-11-15: 2 g via INTRAVENOUS

## 2021-11-15 MED ORDER — ONDANSETRON HCL 4 MG/2ML IJ SOLN
INTRAMUSCULAR | Status: AC
  Filled 2021-11-15: qty 2

## 2021-11-15 MED ORDER — ONDANSETRON HCL 4 MG/2ML IJ SOLN: 4.0000 mg | Freq: Once | INTRAMUSCULAR | Status: DC | PRN

## 2021-11-15 MED ORDER — FENTANYL CITRATE (PF) 100 MCG/2ML IJ SOLN: 25.0000 ug | INTRAMUSCULAR | Status: DC | PRN

## 2021-11-15 MED ORDER — ONDANSETRON HCL 4 MG/2ML IJ SOLN
INTRAMUSCULAR | Status: DC | PRN
  Administered 2021-11-15: 4 mg via INTRAVENOUS

## 2021-11-15 MED ORDER — EPHEDRINE 5 MG/ML INJ
INTRAVENOUS | Status: AC
  Filled 2021-11-15: qty 5

## 2021-11-15 MED ORDER — CEFAZOLIN SODIUM-DEXTROSE 2-4 GM/100ML-% IV SOLN
INTRAVENOUS | Status: AC
  Filled 2021-11-15: qty 100

## 2021-11-15 MED ORDER — OXYCODONE HCL 5 MG PO TABS: 5.0000 mg | ORAL_TABLET | Freq: Once | ORAL | Status: DC | PRN

## 2021-11-15 MED ORDER — FENTANYL CITRATE (PF) 100 MCG/2ML IJ SOLN
100.0000 ug | Freq: Once | INTRAMUSCULAR | Status: AC
  Administered 2021-11-15: 100 ug via INTRAVENOUS

## 2021-11-15 MED ORDER — FENTANYL CITRATE (PF) 100 MCG/2ML IJ SOLN
INTRAMUSCULAR | Status: AC
  Filled 2021-11-15: qty 2

## 2021-11-15 MED ORDER — LACTATED RINGERS IV SOLN
INTRAVENOUS | Status: DC
Start: 2021-11-15 — End: 2021-11-15

## 2021-11-15 MED ORDER — PROPOFOL 10 MG/ML IV BOLUS
INTRAVENOUS | Status: AC
  Filled 2021-11-15: qty 20

## 2021-11-15 MED ORDER — MIDAZOLAM HCL 2 MG/2ML IJ SOLN
INTRAMUSCULAR | Status: DC | PRN
  Administered 2021-11-15: 2 mg via INTRAVENOUS

## 2021-11-15 MED ORDER — ACETAMINOPHEN 500 MG PO TABS
ORAL_TABLET | ORAL | Status: AC
Start: 2021-11-15 — End: ?
  Filled 2021-11-15: qty 2

## 2021-11-15 MED ORDER — OXYCODONE HCL 5 MG/5ML PO SOLN: 5.0000 mg | Freq: Once | ORAL | Status: DC | PRN

## 2021-11-15 MED ORDER — HYDROCODONE-ACETAMINOPHEN 5-325 MG PO TABS: ORAL_TABLET | ORAL | 0 refills | Status: AC

## 2021-11-15 MED ORDER — AMISULPRIDE (ANTIEMETIC) 5 MG/2ML IV SOLN: 10.0000 mg | Freq: Once | INTRAVENOUS | Status: DC | PRN

## 2021-11-15 MED ORDER — EPHEDRINE SULFATE (PRESSORS) 50 MG/ML IJ SOLN
INTRAMUSCULAR | Status: DC | PRN
  Administered 2021-11-15: 5 mg via INTRAVENOUS

## 2021-11-15 MED ORDER — ACETAMINOPHEN 500 MG PO TABS
1000.0000 mg | ORAL_TABLET | Freq: Once | ORAL | Status: AC
  Administered 2021-11-15: 1000 mg via ORAL

## 2021-11-15 MED ORDER — MIDAZOLAM HCL 2 MG/2ML IJ SOLN
2.0000 mg | Freq: Once | INTRAMUSCULAR | Status: AC
  Administered 2021-11-15: 2 mg via INTRAVENOUS

## 2021-11-15 MED ORDER — ROPIVACAINE HCL 5 MG/ML IJ SOLN
INTRAMUSCULAR | Status: DC | PRN
  Administered 2021-11-15: 30 mL via PERINEURAL

## 2021-11-15 SURGICAL SUPPLY — 57 items
APL PRP STRL LF DISP 70% ISPRP (MISCELLANEOUS) ×1
BLADE SURG 15 STRL LF DISP TIS (BLADE) ×2 IMPLANT
BLADE SURG 15 STRL SS (BLADE) ×4
BNDG CMPR 9X4 STRL LF SNTH (GAUZE/BANDAGES/DRESSINGS) ×1
BNDG ELASTIC 2X5.8 VLCR STR LF (GAUZE/BANDAGES/DRESSINGS) IMPLANT
BNDG ELASTIC 3X5.8 VLCR STR LF (GAUZE/BANDAGES/DRESSINGS) ×2 IMPLANT
BNDG ESMARK 4X9 LF (GAUZE/BANDAGES/DRESSINGS) ×2 IMPLANT
BNDG GAUZE DERMACEA FLUFF (GAUZE/BANDAGES/DRESSINGS) ×1
BNDG GAUZE DERMACEA FLUFF 4 (GAUZE/BANDAGES/DRESSINGS) ×1 IMPLANT
BNDG GZE DERMACEA 4 6PLY (GAUZE/BANDAGES/DRESSINGS) ×1
CHLORAPREP W/TINT 26 (MISCELLANEOUS) ×2 IMPLANT
CORD BIPOLAR FORCEPS 12FT (ELECTRODE) ×2 IMPLANT
COVER BACK TABLE 60X90IN (DRAPES) ×2 IMPLANT
COVER MAYO STAND STRL (DRAPES) ×2 IMPLANT
CUFF TOURN SGL QUICK 18X4 (TOURNIQUET CUFF) ×2 IMPLANT
DRAPE EXTREMITY T 121X128X90 (DISPOSABLE) ×2 IMPLANT
DRAPE OEC MINIVIEW 54X84 (DRAPES) ×2 IMPLANT
DRAPE SURG 17X23 STRL (DRAPES) ×2 IMPLANT
GAUZE SPONGE 4X4 12PLY STRL (GAUZE/BANDAGES/DRESSINGS) ×2 IMPLANT
GAUZE XEROFORM 1X8 LF (GAUZE/BANDAGES/DRESSINGS) ×2 IMPLANT
GLOVE BIO SURGEON STRL SZ7.5 (GLOVE) ×2 IMPLANT
GLOVE BIOGEL PI IND STRL 8 (GLOVE) ×1 IMPLANT
GLOVE BIOGEL PI IND STRL 8.5 (GLOVE) IMPLANT
GLOVE BIOGEL PI INDICATOR 8 (GLOVE) ×1
GLOVE BIOGEL PI INDICATOR 8.5 (GLOVE)
GLOVE SURG ORTHO 8.0 STRL STRW (GLOVE) IMPLANT
GOWN STRL REUS W/ TWL LRG LVL3 (GOWN DISPOSABLE) ×1 IMPLANT
GOWN STRL REUS W/TWL LRG LVL3 (GOWN DISPOSABLE)
GOWN STRL REUS W/TWL XL LVL3 (GOWN DISPOSABLE) ×2 IMPLANT
K-WIRE DBL .035X4 NSTRL (WIRE) ×4
KWIRE DBL .035X4 NSTRL (WIRE) IMPLANT
NDL HYPO 25X1 1.5 SAFETY (NEEDLE) IMPLANT
NEEDLE HYPO 25X1 1.5 SAFETY (NEEDLE) IMPLANT
NS IRRIG 1000ML POUR BTL (IV SOLUTION) ×2 IMPLANT
PACK BASIN DAY SURGERY FS (CUSTOM PROCEDURE TRAY) ×2 IMPLANT
PAD CAST 3X4 CTTN HI CHSV (CAST SUPPLIES) IMPLANT
PAD CAST 4YDX4 CTTN HI CHSV (CAST SUPPLIES) ×1 IMPLANT
PADDING CAST COTTON 3X4 STRL (CAST SUPPLIES) ×2
PADDING CAST COTTON 4X4 STRL (CAST SUPPLIES) ×2
SLEEVE SCD COMPRESS KNEE MED (STOCKING) IMPLANT
SLING ARM FOAM STRAP MED (SOFTGOODS) ×1 IMPLANT
SPLINT PLASTER CAST XFAST 3X15 (CAST SUPPLIES) IMPLANT
SPLINT PLASTER CAST XFAST 4X15 (CAST SUPPLIES) IMPLANT
SPLINT PLASTER XTRA FAST SET 4 (CAST SUPPLIES)
SPLINT PLASTER XTRA FASTSET 3X (CAST SUPPLIES) ×10
STOCKINETTE 4X48 STRL (DRAPES) ×2 IMPLANT
SUT CHROMIC 4 0 PS 2 18 (SUTURE) ×1 IMPLANT
SUT ETHILON 3 0 PS 1 (SUTURE) IMPLANT
SUT ETHILON 4 0 PS 2 18 (SUTURE) ×1 IMPLANT
SUT MERSILENE 4 0 P 3 (SUTURE) IMPLANT
SUT VIC AB 3-0 PS1 18 (SUTURE)
SUT VIC AB 3-0 PS1 18XBRD (SUTURE) IMPLANT
SUT VICRYL 4-0 PS2 18IN ABS (SUTURE) ×1 IMPLANT
SYR BULB EAR ULCER 3OZ GRN STR (SYRINGE) ×2 IMPLANT
SYR CONTROL 10ML LL (SYRINGE) IMPLANT
TOWEL GREEN STERILE FF (TOWEL DISPOSABLE) ×4 IMPLANT
UNDERPAD 30X36 HEAVY ABSORB (UNDERPADS AND DIAPERS) ×2 IMPLANT

## 2021-11-15 NOTE — Anesthesia Procedure Notes (Signed)
Anesthesia Regional Block: Supraclavicular block   Pre-Anesthetic Checklist: , timeout performed,  Correct Patient, Correct Site, Correct Laterality,  Correct Procedure, Correct Position, site marked,  Risks and benefits discussed,  Surgical consent,  Pre-op evaluation,  At surgeon's request and post-op pain management  Laterality: Right  Prep: chloraprep       Needles:  Injection technique: Single-shot  Needle Type: Echogenic Stimulator Needle     Needle Length: 10cm  Needle Gauge: 20     Additional Needles:   Procedures:,,,, ultrasound used (permanent image in chart),,    Narrative:  Start time: 11/15/2021 1:30 PM End time: 11/15/2021 1:34 PM Injection made incrementally with aspirations every 5 mL.  Performed by: Personally  Anesthesiologist: Lucretia Kern, MD  Additional Notes: Standard monitors applied. Skin prepped. Good needle visualization with ultrasound. Injection made in 5cc increments with no resistance to injection. Patient tolerated the procedure well.

## 2021-11-15 NOTE — Progress Notes (Signed)
Assisted Dr. Witman with right, supraclavicular, ultrasound guided block. Side rails up, monitors on throughout procedure. See vital signs in flow sheet. Tolerated Procedure well. 

## 2021-11-15 NOTE — Op Note (Signed)
NAME: Stacy Rogers MEDICAL RECORD NO: [id number] DATE OF BIRTH: Jun 14, 1985 FACILITY: [facility] LOCATION: [facility] PHYSICIAN: Tami Ribas, MD   OPERATIVE REPORT   DATE OF PROCEDURE: 11/15/21    PREOPERATIVE DIAGNOSIS: Right small finger metacarpal   POSTOPERATIVE DIAGNOSIS: Right small finger metacarpal fracture   PROCEDURE: Please reduction pin fixation right small finger metacarpal fracture   SURGEON:  Betha Loa, M.D.   ASSISTANT: none   ANESTHESIA:  Regional with sedation   INTRAVENOUS FLUIDS:  Per anesthesia flow sheet.   ESTIMATED BLOOD LOSS:  Minimal.   COMPLICATIONS:  None.   SPECIMENS:  none   TOURNIQUET TIME:    Total Tourniquet Time Documented: Upper Arm (Right) - 20 minutes Total: Upper Arm (Right) - 20 minutes    DISPOSITION:  Stable to PACU.   INDICATIONS: 36 year old female states she injured her right hand in a fall 1 week ago.  She was seen at the emergency department where radiographs were taken revealing a small finger metacarpal fracture.  Splinted and followed up in the office.  She wished to proceed with operative reduction and fixation.  Risks, benefits and alternatives of surgery were discussed including the risks of blood loss, infection, damage to nerves, vessels, tendons, ligaments, bone for surgery, need for additional surgery, complications with wound healing, continued pain, stiffness, , nonunion, malunion.  She voiced understanding of these risks and elected to proceed.  OPERATIVE COURSE:  After being identified preoperatively by myself,  the patient and I agreed on the procedure and site of the procedure.  The surgical site was marked.  Surgical consent had been signed. Preoperative IV antibiotic prophylaxis was given. She was transferred to the operating room and placed on the operating table in supine position with the Right upper extremity on an arm board.  Sedation was induced by the anesthesiologist. A regional block  had been performed by anesthesia in preoperative holding.    Right upper extremity was prepped and draped in normal sterile orthopedic fashion.  A surgical pause was performed between the surgeons, anesthesia, and operating room staff and all were in agreement as to the patient, procedure, and site of procedure.  Tourniquet at the proximal aspect of the extremity was inflated to 250 mmHg after exsanguination of the arm with an Esmarch bandage.  C-arm was used in AP lateral oblique projections at the case.  Closed reduction of the right small finger metacarpal fracture was performed.  Good reduction was obtained.  0.035 inch K wires were used.  These were advanced from the ulnar side of the small finger metacarpal and into the ring finger metacarpal.  2 were advanced distal to the fracture 1 through the fracture and one proximal to the fracture.  This was adequate to stabilize the fracture and good reduction.  The wrist was placed through tenodesis and the scissoring of the small finger had been corrected.  C-arm was used in AP lateral oblique projections to ensure appropriate reduction position of hardware which was the case.  The pins were bent and cut short.  The pin sites were dressed with sterile Xeroform 4 x 4's and wrapped with a Kerlix bandage.  A volar and dorsal slab splint including the long ring and small fingers was placed with the MPs flexed and the MPs extended.  This was wrapped with Kerlix and Ace bandage.  The tourniquet was deflated at 20 minutes.  Fingertips were pink with brisk capillary refill after deflation of tourniquet.  The operative  drapes  were broken down.  The patient was awoken from anesthesia safely.  She was transferred back to the stretcher and taken to PACU in stable condition.  I will see her back in the office in 1 week for postoperative followup.  I will give her a prescription for Norco 5/325 1-2 tabs PO q6 hours prn pain, dispense # 20.   Betha Loa, MD Electronically  signed, 11/15/21

## 2021-11-15 NOTE — H&P (Signed)
Stacy Rogers is an 36 y.o. female.   Chief Complaint: metacarpal fracture HPI: 36 yo female states she injured right hand in fall one week ago.  Seen at ED where XR revealed metacarpal fracture.  Splinted and followed up in office.  She wishes to proceed with operative reduction and fixation.  Allergies:  Allergies  Allergen Reactions   Aspirin Swelling    Only to the lips   Aspirin Swelling    Blisters   Tramadol Nausea And Vomiting    headache    Past Medical History:  Diagnosis Date   Anxiety    Asthma    Depression     Past Surgical History:  Procedure Laterality Date   NECK SURGERY  05/2018   SHOULDER SURGERY Left     Family History: Family History  Problem Relation Age of Onset   COPD Mother    Alcohol abuse Father    Colon cancer Neg Hx     Social History:   reports that she has been smoking cigarettes. She has a 9.00 pack-year smoking history. She has never used smokeless tobacco. She reports that she does not drink alcohol and does not use drugs.  Medications: Medications Prior to Admission  Medication Sig Dispense Refill   albuterol (PROVENTIL HFA;VENTOLIN HFA) 108 (90 Base) MCG/ACT inhaler Inhale 2 puffs into the lungs 2 (two) times daily as needed for wheezing or shortness of breath. 8 g 2   albuterol (PROVENTIL) (2.5 MG/3ML) 0.083% nebulizer solution Take 3 mLs (2.5 mg total) by nebulization every 6 (six) hours as needed for wheezing or shortness of breath. 75 mL 12   diazepam (VALIUM) 5 MG tablet Take 5 mg by mouth 2 (two) times daily as needed for anxiety or sleep.     feeding supplement (ENSURE ENLIVE / ENSURE PLUS) LIQD Take 237 mLs by mouth 2 (two) times daily between meals. 237 mL 12   guaiFENesin-dextromethorphan (ROBITUSSIN DM) 100-10 MG/5ML syrup Take 5 mLs by mouth every 4 (four) hours as needed for cough. 118 mL 0   methocarbamol (ROBAXIN) 500 MG tablet Take 500 mg by mouth every 8 (eight) hours as needed for spasms.     montelukast  (SINGULAIR) 10 MG tablet Take 1 tablet (10 mg total) by mouth at bedtime. 90 tablet 1   nicotine (NICODERM CQ - DOSED IN MG/24 HOURS) 21 mg/24hr patch Place 1 patch (21 mg total) onto the skin daily. 28 patch 0   predniSONE (DELTASONE) 10 MG tablet Take 4 tablets (40 mg) daily for 2 days, then, Take 3 tablets (30 mg) daily for 2 days, then, Take 2 tablets (20 mg) daily for 2 days, then, Take 1 tablets (10 mg) daily for 1 days, then stop 19 tablet 0   rizatriptan (MAXALT) 10 MG tablet Take 10 mg by mouth as needed for migraine. May repeat in 2 hours if needed     SYMBICORT 160-4.5 MCG/ACT inhaler Inhale 2 puffs into the lungs 2 (two) times daily.     traZODone (DESYREL) 50 MG tablet Take 50 mg by mouth at bedtime.     valACYclovir (VALTREX) 500 MG tablet Take 500 mg by mouth 2 (two) times daily as needed (break outs).     venlafaxine XR (EFFEXOR-XR) 75 MG 24 hr capsule Take 75 mg by mouth daily.      No results found for this or any previous visit (from the past 48 hour(s)).  No results found.    There were no vitals taken for this  visit.  General appearance: alert, cooperative, and appears stated age Head: Normocephalic, without obvious abnormality, atraumatic Neck: supple, symmetrical, trachea midline Extremities: Intact sensation and capillary refill all digits.  +epl/fpl/io.  No wounds.  Pulses: 2+ and symmetric Skin: Skin color, texture, turgor normal. No rashes or lesions Neurologic: Grossly normal Incision/Wound: none  Assessment/Plan Right small finger metacarpal fracture.  Non operative and operative treatment options have been discussed with the patient and patient wishes to proceed with operative treatment. Risks, benefits, and alternatives of surgery have been discussed and the patient agrees with the plan of care.   Betha Loa 11/15/2021, 12:50 PM

## 2021-11-15 NOTE — Transfer of Care (Signed)
Immediate Anesthesia Transfer of Care Note  Patient: Stacy Rogers  Procedure(s) Performed: CLOSED REDUCTION PERCUTANEOUS PINNING RIGHT SMALL FINGER METACARPAL SHAFT FRACTURE (Right: Finger)  Patient Location: PACU  Anesthesia Type:MAC combined with regional for post-op pain  Level of Consciousness: drowsy  Airway & Oxygen Therapy: Patient Spontanous Breathing and Patient connected to face mask oxygen  Post-op Assessment: Report given to RN and Post -op Vital signs reviewed and stable  Post vital signs: Reviewed and stable  Last Vitals:  Vitals Value Taken Time  BP 103/59 11/15/21 1452  Temp    Pulse 81 11/15/21 1453  Resp 20 11/15/21 1453  SpO2 98 % 11/15/21 1453  Vitals shown include unvalidated device data.  Last Pain:  Vitals:   11/15/21 1308  TempSrc: Oral  PainSc: 8          Complications: No notable events documented.

## 2021-11-15 NOTE — Anesthesia Postprocedure Evaluation (Signed)
Anesthesia Post Note  Patient: Stacy Rogers  Procedure(s) Performed: CLOSED REDUCTION PERCUTANEOUS PINNING RIGHT SMALL FINGER METACARPAL SHAFT FRACTURE (Right: Finger)     Patient location during evaluation: PACU Anesthesia Type: Regional Level of consciousness: awake and alert Pain management: pain level controlled Vital Signs Assessment: post-procedure vital signs reviewed and stable Respiratory status: spontaneous breathing, nonlabored ventilation and respiratory function stable Cardiovascular status: blood pressure returned to baseline and stable Postop Assessment: no apparent nausea or vomiting Anesthetic complications: no   No notable events documented.  Last Vitals:  Vitals:   11/15/21 1530 11/15/21 1551  BP: 108/61 99/68  Pulse: 64 77  Resp: 17 18  Temp:  (!) 36.2 C  SpO2: 99% 93%    Last Pain:  Vitals:   11/15/21 1551  TempSrc:   PainSc: 0-No pain                 Lucretia Kern

## 2021-11-15 NOTE — Anesthesia Preprocedure Evaluation (Signed)
Anesthesia Evaluation  Patient identified by MRN, date of birth, ID band Patient awake    Reviewed: Allergy & Precautions, NPO status , Patient's Chart, lab work & pertinent test results  History of Anesthesia Complications Negative for: history of anesthetic complications  Airway Mallampati: II  TM Distance: >3 FB Neck ROM: Full    Dental   Pulmonary asthma , Current Smoker and Patient abstained from smoking.,    Pulmonary exam normal        Cardiovascular negative cardio ROS Normal cardiovascular exam     Neuro/Psych Anxiety Depression negative neurological ROS     GI/Hepatic negative GI ROS, Neg liver ROS,   Endo/Other  negative endocrine ROS  Renal/GU negative Renal ROS  negative genitourinary   Musculoskeletal negative musculoskeletal ROS (+)   Abdominal   Peds  Hematology negative hematology ROS (+)   Anesthesia Other Findings 5th metacarpal fracture  Reproductive/Obstetrics                             Anesthesia Physical Anesthesia Plan  ASA: 2  Anesthesia Plan: MAC and Regional   Post-op Pain Management: Regional block*, Tylenol PO (pre-op)* and Toradol IV (intra-op)*   Induction: Intravenous  PONV Risk Score and Plan: 2 and Propofol infusion, TIVA and Treatment may vary due to age or medical condition  Airway Management Planned: Natural Airway, Nasal Cannula and Simple Face Mask  Additional Equipment: None  Intra-op Plan:   Post-operative Plan:   Informed Consent: I have reviewed the patients History and Physical, chart, labs and discussed the procedure including the risks, benefits and alternatives for the proposed anesthesia with the patient or authorized representative who has indicated his/her understanding and acceptance.       Plan Discussed with:   Anesthesia Plan Comments:         Anesthesia Quick Evaluation

## 2021-11-15 NOTE — Discharge Instructions (Addendum)
[facility] Instructions Hand Surgery  Wound Care: Keep your hand elevated above the level of your heart.  Do not allow it to dangle by your side.  Keep the dressing dry and do not remove it unless your doctor advises you to do so.  He will usually change it at the time of your post-op visit.  Moving your fingers is advised to stimulate circulation but will depend on the site of your surgery.  If you have a splint applied, your doctor will advise you regarding movement.  Activity: Do not drive or operate machinery today.  Rest today and then you may return to your normal activity and work as indicated by your physician.  Diet:  Drink liquids today or eat a light diet.  You may resume a regular diet tomorrow.    General expectations: Pain for two to three days. Fingers may become slightly swollen.  Call your doctor if any of the following occur: Severe pain not relieved by pain medication. Elevated temperature. Dressing soaked with blood. Inability to move fingers. White or bluish color to fingers.   No Tylenol until after 7pm today if needed   Post Anesthesia Home Care Instructions  Activity: Get plenty of rest for the remainder of the day. A responsible individual must stay with you for 24 hours following the procedure.  For the next 24 hours, DO NOT: -Drive a car -Advertising copywriter -Drink alcoholic beverages -Take any medication unless instructed by your physician -Make any legal decisions or sign important papers.  Meals: Start with liquid foods such as gelatin or soup. Progress to regular foods as tolerated. Avoid greasy, spicy, heavy foods. If nausea and/or vomiting occur, drink only clear liquids until the nausea and/or vomiting subsides. Call your physician if vomiting continues.  Special Instructions/Symptoms: Your throat may feel dry or sore from the anesthesia or the breathing tube placed in your throat during surgery. If this causes discomfort, gargle with warm  salt water. The discomfort should disappear within 24 hours.  If you had a scopolamine patch placed behind your ear for the management of post- operative nausea and/or vomiting:  1. The medication in the patch is effective for 72 hours, after which it should be removed.  Wrap patch in a tissue and discard in the trash. Wash hands thoroughly with soap and water. 2. You may remove the patch earlier than 72 hours if you experience unpleasant side effects which may include dry mouth, dizziness or visual disturbances. 3. Avoid touching the patch. Wash your hands with soap and water after contact with the patch.  Regional Anesthesia Blocks  1. Numbness or the inability to move the "blocked" extremity may last from 3-48 hours after placement. The length of time depends on the medication injected and your individual response to the medication. If the numbness is not going away after 48 hours, call your surgeon.  2. The extremity that is blocked will need to be protected until the numbness is gone and the  Strength has returned. Because you cannot feel it, you will need to take extra care to avoid injury. Because it may be weak, you may have difficulty moving it or using it. You may not know what position it is in without looking at it while the block is in effect.  3. For blocks in the legs and feet, returning to weight bearing and walking needs to be done carefully. You will need to wait until the numbness is entirely gone and the strength has returned.  You should be able to move your leg and foot normally before you try and bear weight or walk. You will need someone to be with you when you first try to ensure you do not fall and possibly risk injury.  4. Bruising and tenderness at the needle site are common side effects and will resolve in a few days.  5. Persistent numbness or new problems with movement should be communicated to the surgeon or the [facility] [phone] [facility] [phone]).

## 2021-11-16 ENCOUNTER — Encounter (HOSPITAL_BASED_OUTPATIENT_CLINIC_OR_DEPARTMENT_OTHER): Payer: Self-pay | Admitting: Orthopedic Surgery

## 2022-02-23 ENCOUNTER — Ambulatory Visit (HOSPITAL_COMMUNITY): Payer: BC Managed Care – PPO | Admitting: Physical Therapy

## 2022-03-20 ENCOUNTER — Ambulatory Visit (HOSPITAL_COMMUNITY): Payer: BC Managed Care – PPO | Admitting: Physical Therapy

## 2022-06-08 ENCOUNTER — Encounter: Payer: Self-pay | Admitting: Radiology
